# Patient Record
Sex: Female | Born: 1990
Health system: Southern US, Community
[De-identification: ages and names within clinical notes are randomized; demographics above are authoritative.]

## PROBLEM LIST (undated history)

## (undated) ENCOUNTER — Inpatient Hospital Stay (HOSPITAL_COMMUNITY): Payer: Self-pay

## (undated) DIAGNOSIS — F449 Dissociative and conversion disorder, unspecified: Secondary | ICD-10-CM

## (undated) DIAGNOSIS — T7421XA Adult sexual abuse, confirmed, initial encounter: Secondary | ICD-10-CM

## (undated) DIAGNOSIS — F4310 Post-traumatic stress disorder, unspecified: Secondary | ICD-10-CM

## (undated) DIAGNOSIS — F32A Depression, unspecified: Secondary | ICD-10-CM

## (undated) DIAGNOSIS — Z8619 Personal history of other infectious and parasitic diseases: Secondary | ICD-10-CM

## (undated) DIAGNOSIS — R569 Unspecified convulsions: Secondary | ICD-10-CM

## (undated) DIAGNOSIS — F445 Conversion disorder with seizures or convulsions: Secondary | ICD-10-CM

## (undated) HISTORY — PX: DILATION AND CURETTAGE OF UTERUS: SHX78

## (undated) HISTORY — DX: Personal history of other infectious and parasitic diseases: Z86.19

## (undated) HISTORY — PX: CHOLECYSTECTOMY: SHX55

## (undated) HISTORY — DX: Dissociative and conversion disorder, unspecified: F44.9

## (undated) HISTORY — PX: MOUTH SURGERY: SHX715

## (undated) HISTORY — DX: Post-traumatic stress disorder, unspecified: F43.10

## (undated) HISTORY — DX: Unspecified convulsions: R56.9

## (undated) HISTORY — DX: Conversion disorder with seizures or convulsions: F44.5

---
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
## 2009-08-09 ENCOUNTER — Emergency Department (HOSPITAL_COMMUNITY): Admission: EM | Admit: 2009-08-09 | Discharge: 2009-08-09 | Payer: Self-pay | Admitting: Emergency Medicine

## 2009-08-29 ENCOUNTER — Emergency Department (HOSPITAL_COMMUNITY): Admission: EM | Admit: 2009-08-29 | Discharge: 2009-08-29 | Payer: Self-pay | Admitting: Emergency Medicine

## 2010-03-13 ENCOUNTER — Emergency Department (HOSPITAL_COMMUNITY): Admission: EM | Admit: 2010-03-13 | Discharge: 2010-03-13 | Payer: Self-pay | Admitting: Emergency Medicine

## 2010-07-27 LAB — POCT PREGNANCY, URINE: Preg Test, Ur: NEGATIVE

## 2010-08-02 LAB — URINALYSIS, ROUTINE W REFLEX MICROSCOPIC
Bilirubin Urine: NEGATIVE
Glucose, UA: NEGATIVE mg/dL
Ketones, ur: NEGATIVE mg/dL
Leukocytes, UA: NEGATIVE
Nitrite: NEGATIVE
Protein, ur: NEGATIVE mg/dL
Specific Gravity, Urine: >1.03 — ABNORMAL HIGH (ref 1.005–1.030)
Urobilinogen, UA: 0.2 mg/dL (ref 0.0–1.0)
pH: 5.5 (ref 5.0–8.0)

## 2010-08-02 LAB — WET PREP, GENITAL
Clue Cells Wet Prep HPF POC: NONE SEEN
Trich, Wet Prep: NONE SEEN
WBC, Wet Prep HPF POC: NONE SEEN
Yeast Wet Prep HPF POC: NONE SEEN

## 2010-08-02 LAB — GC/CHLAMYDIA PROBE AMP, GENITAL
Chlamydia, DNA Probe: NEGATIVE
GC Probe Amp, Genital: NEGATIVE

## 2010-08-02 LAB — PREGNANCY, URINE: Preg Test, Ur: NEGATIVE

## 2010-08-02 LAB — URINE MICROSCOPIC-ADD ON

## 2010-08-02 LAB — HCG, QUANTITATIVE, PREGNANCY: hCG, Beta Chain, Quant, S: <2 m[IU]/mL (ref <5)
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
## 2010-09-30 NOTE — Procedures (Signed)
CLINICAL HISTORY:  This 20 year old patient is thought to have had seizures,  possibly representing pseudoseizures in the past.   TECHNICAL DESCRIPTION:  This EEG was recorded during the awake state.  Background activity shows a well formed alpha activity in the 12 to 14 hertz  range with superimposed low voltage fast beta activity and some muscle  artifact in the anterior head regions. No Stage 2 sleep was present. No  focal asymmetries or epileptiform activity was seen. Photic stimulation was  performed which produced no abnormalities. Hyperventilation testing did not  produce any abnormalities.   IMPRESSION:  This is a normal EEG during the awake state.    Evie Lacks, M.D.   ZOX:WRUE  D:  01/15/2004 12:05:20  T:  01/16/2004 10:17:00  Job #:  [id number]
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
## 2010-10-04 IMAGING — CT CT CERVICAL SPINE W/O CM
1 of 2 series · 9 of 14 positions shown, 12 images · non-contrast
Comparison: Head CT 03/27/2008.  Cervical spine CT 03/07/2004.

CLINICAL DATA: Fall yesterday.

CT HEAD WITHOUT CONTRAST
TECHNIQUE: Contiguous axial images were obtained from the base of
the skull through the vertex without contrast
TECHNIQUE: Continous axial images were obtained of the cervical
spine without contrast.  Sagittal and coronal reformats were
constructed.

[Series 4: cervical 2.0 b31s · axial · 0.24mm/px · z∈[+48,+186]mm · 9 of 116 slices shown, 12 images]
[im 12/116  soft-tissue]
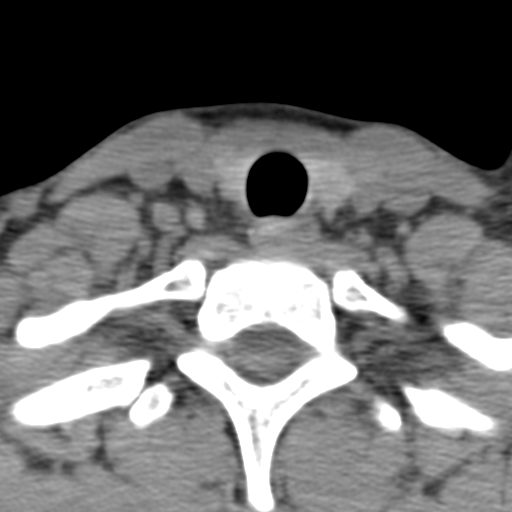
[im 12/116  bone]
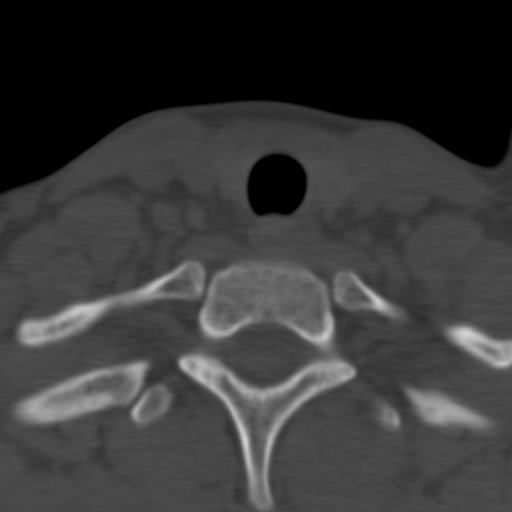
[im 24/116  bone]
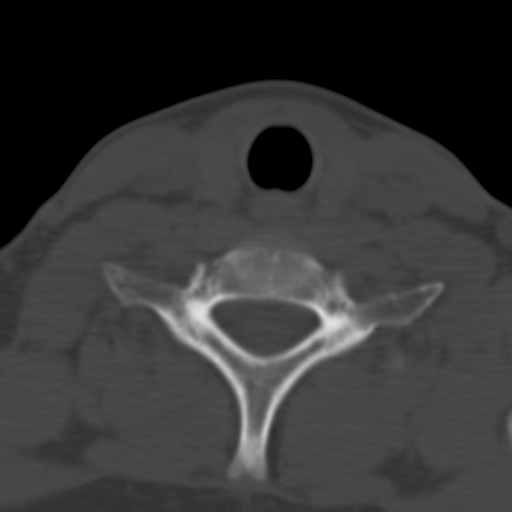
[im 35/116  bone]
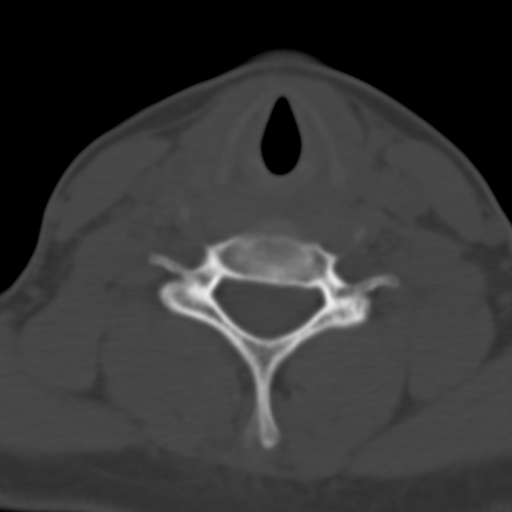
[im 47/116  bone]
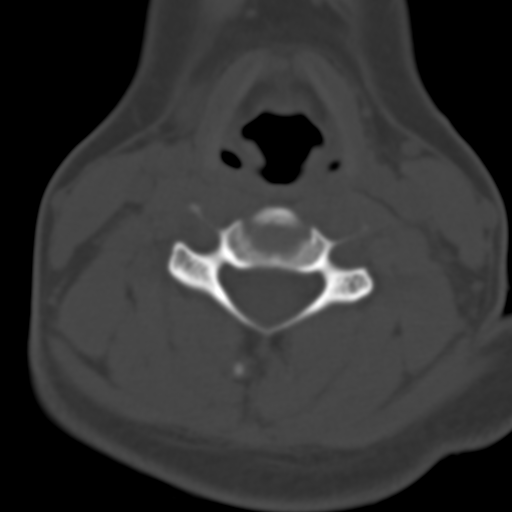
[im 58/116  soft-tissue]
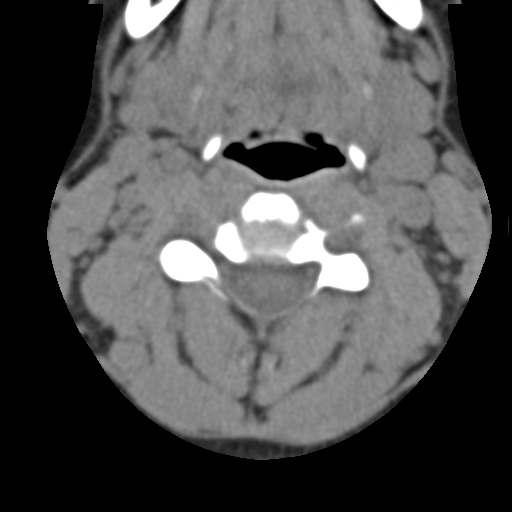
[im 58/116  bone]
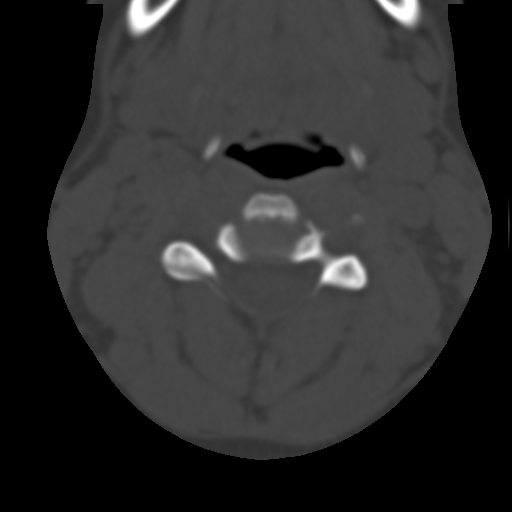
[im 70/116  bone]
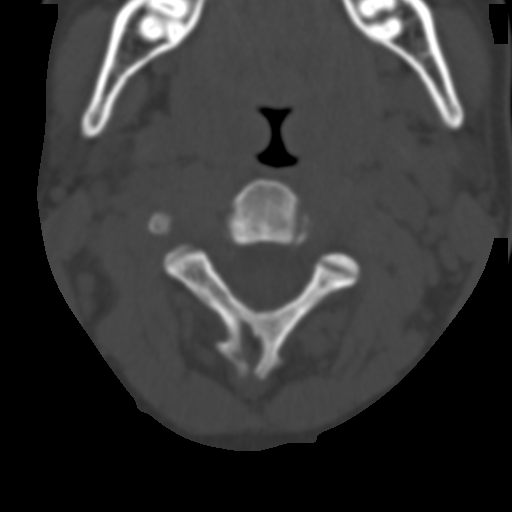
[im 81/116  bone]
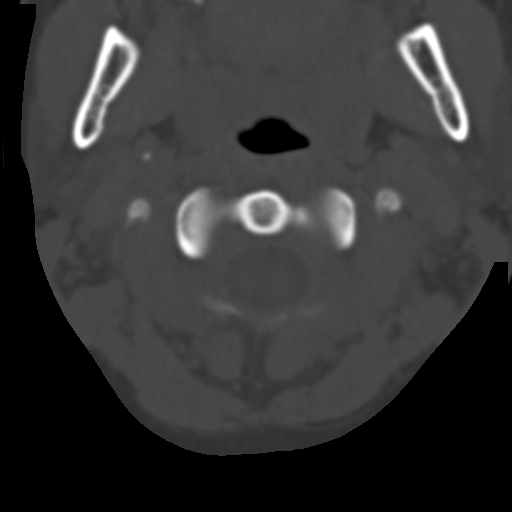
[im 93/116  bone]
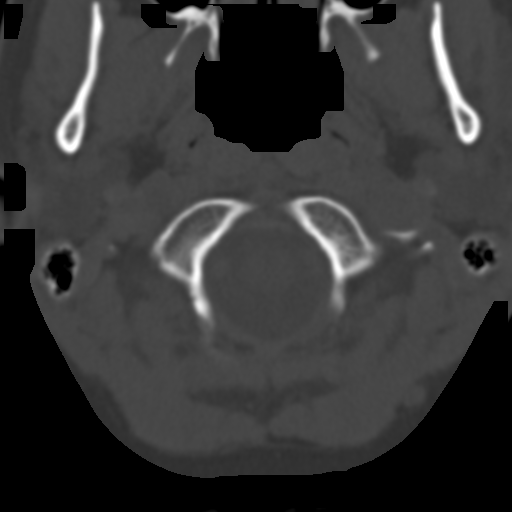
[im 104/116  soft-tissue]
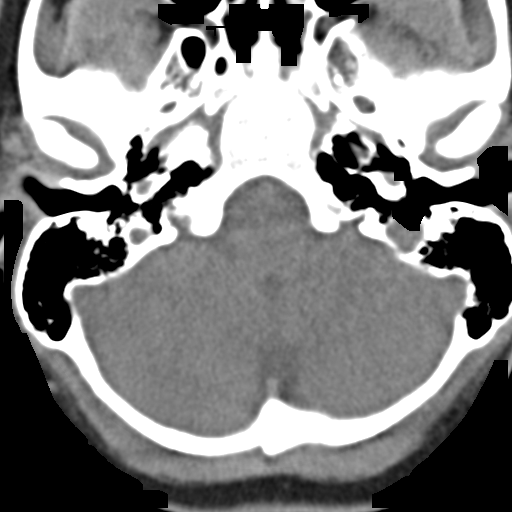
[im 104/116  bone]
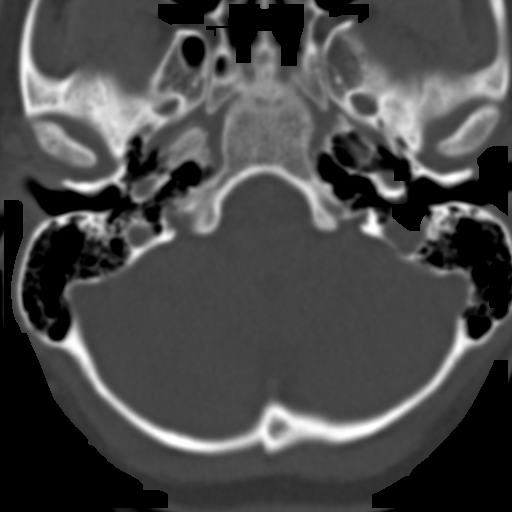

[9 of 14 positions shown; findings below may reference images not displayed]

FINDINGS: Bone windows demonstrate no significant soft tissue
swelling. Clear paranasal sinuses and mastoid air cells. No skull
fracture.

Soft tissue windows demonstrate no  mass lesion, hemorrhage,
hydrocephalus, acute infarct, intra-axial, or extra-axial fluid
collection.
IMPRESSION: No acute intracranial abnormality.

CT CERVICAL SPINE WITHOUT CONTRAST
FINDINGS: Spinal visualization through the bottom of T1.
Prevertebral soft tissues are normal.  No apical pneumothorax.
Skull base intact.  Mild straightening expected cervical lordosis.
No evidence of acute fracture or subluxation.  Facets are well-
aligned.  C1-C2 articulation within normal limits.
IMPRESSION: 1.  No evidence of cervical spine fracture or subluxation.
2.  Soft tissue/ligamentous injury cannot be excluded.
3. Straightening of expected cervical lordosis could be positional,
due to muscular spasm, or ligamentous injury.

## 2011-09-01 LAB — OB RESULTS CONSOLE RPR: RPR: NONREACTIVE

## 2011-09-01 LAB — OB RESULTS CONSOLE HEPATITIS B SURFACE ANTIGEN: Hepatitis B Surface Ag: NEGATIVE

## 2011-09-01 LAB — OB RESULTS CONSOLE RUBELLA ANTIBODY, IGM: Rubella: IMMUNE

## 2011-09-01 LAB — OB RESULTS CONSOLE ANTIBODY SCREEN: Antibody Screen: NEGATIVE

## 2011-09-01 LAB — OB RESULTS CONSOLE HIV ANTIBODY (ROUTINE TESTING): HIV: NONREACTIVE

## 2011-09-04 ENCOUNTER — Other Ambulatory Visit (HOSPITAL_COMMUNITY)
Admission: RE | Admit: 2011-09-04 | Discharge: 2011-09-04 | Disposition: A | Discharge status: Home / Self Care | Payer: Medicaid Other | Source: Ambulatory Visit | Attending: Obstetrics and Gynecology | Admitting: Obstetrics and Gynecology

## 2011-09-04 ENCOUNTER — Other Ambulatory Visit: Payer: Self-pay | Admitting: Adult Health

## 2011-09-04 DIAGNOSIS — Z113 Encounter for screening for infections with a predominantly sexual mode of transmission: Secondary | ICD-10-CM | POA: Insufficient documentation

## 2011-09-04 DIAGNOSIS — Z01419 Encounter for gynecological examination (general) (routine) without abnormal findings: Secondary | ICD-10-CM | POA: Insufficient documentation

## 2012-03-22 ENCOUNTER — Encounter (HOSPITAL_COMMUNITY): Payer: Self-pay | Admitting: *Deleted

## 2012-03-22 ENCOUNTER — Inpatient Hospital Stay (HOSPITAL_COMMUNITY)
Admission: AD | Admit: 2012-03-22 | Discharge: 2012-03-22 | Disposition: A | Discharge status: Home / Self Care | Payer: Medicaid Other | Source: Ambulatory Visit | Attending: Obstetrics & Gynecology | Admitting: Obstetrics & Gynecology

## 2012-03-22 DIAGNOSIS — M549 Dorsalgia, unspecified: Secondary | ICD-10-CM | POA: Insufficient documentation

## 2012-03-22 DIAGNOSIS — O99891 Other specified diseases and conditions complicating pregnancy: Secondary | ICD-10-CM

## 2012-03-22 DIAGNOSIS — R109 Unspecified abdominal pain: Secondary | ICD-10-CM | POA: Insufficient documentation

## 2012-03-22 DIAGNOSIS — O4700 False labor before 37 completed weeks of gestation, unspecified trimester: Secondary | ICD-10-CM | POA: Insufficient documentation

## 2012-03-22 HISTORY — DX: Unspecified convulsions: R56.9

## 2012-03-22 LAB — URINE MICROSCOPIC-ADD ON

## 2012-03-22 LAB — URINALYSIS, ROUTINE W REFLEX MICROSCOPIC
Bilirubin Urine: NEGATIVE
Glucose, UA: NEGATIVE mg/dL
Ketones, ur: NEGATIVE mg/dL
Leukocytes, UA: NEGATIVE
Nitrite: NEGATIVE
Protein, ur: NEGATIVE mg/dL
Specific Gravity, Urine: 1.01 (ref 1.005–1.030)
Urobilinogen, UA: 0.2 mg/dL (ref 0.0–1.0)
pH: 7 (ref 5.0–8.0)

## 2012-03-22 MED ORDER — CYCLOBENZAPRINE HCL 5 MG PO TABS: 5.0000 mg | ORAL_TABLET | Freq: Three times a day (TID) | ORAL | Status: DC | PRN

## 2012-03-22 MED ORDER — CYCLOBENZAPRINE HCL 10 MG PO TABS
5.0000 mg | ORAL_TABLET | Freq: Once | ORAL | Status: AC
  Administered 2012-03-22: 5 mg via ORAL
  Filled 2012-03-22: qty 1

## 2012-03-22 MED ORDER — ACETAMINOPHEN 325 MG PO TABS
650.0000 mg | ORAL_TABLET | Freq: Four times a day (QID) | ORAL | Status: DC | PRN
  Administered 2012-03-22: 650 mg via ORAL
  Filled 2012-03-22: qty 2

## 2012-03-22 NOTE — MAU Note (Signed)
Pt states she has a sudden onset of lower back pain around 0030-it is constant

## 2012-03-22 NOTE — MAU Provider Note (Signed)
  History     CSN: [id number]  Arrival date and time: 03/22/12 0126   None     Chief Complaint  Patient presents with  . Back Pain   HPI Patient is a 21 yo G1 at [redacted]w[redacted]d presenting for sudden onset severe back pain. She has some cramping in her abdomen, but her pain is 5/10 in her lower back. She denies LOF, bleeding or vaginal discharge. Good fetal movement. No dysuria.  She is seen at [facility]. PMH of PTSD, conversion disorder and pseudoseizures. No current medications.   OB History    Grav Para Term Preterm Abortions TAB SAB Ect Mult Living   1               Past Medical History  Diagnosis Date  . Seizures     Past Surgical History  Procedure Date  . Oral antral fistula closure     oral surgery    Family History  Problem Relation Age of Onset  . Cancer Mother   . Diabetes Father     History  Substance Use Topics  . Smoking status: Current Every Day Smoker -- 0.5 packs/day  . Smokeless tobacco: Never Used  . Alcohol Use: No    Allergies:  Allergies  Allergen Reactions  . Ambien (Zolpidem Tartrate)     seizures    Prescriptions prior to admission  Medication Sig Dispense Refill  . Prenatal Vit-Fe Fumarate-FA (MULTIVITAMIN-PRENATAL) 27-0.8 MG TABS Take 1 tablet by mouth daily.        Review of Systems  Constitutional: Negative for fever and chills.  Respiratory: Negative for shortness of breath.   Cardiovascular: Negative for chest pain.  Gastrointestinal: Negative for nausea and vomiting.  Genitourinary: Positive for frequency. Negative for dysuria.  Musculoskeletal: Positive for back pain.  Neurological: Negative for dizziness and headaches.   Physical Exam   Blood pressure 124/66, pulse 108, temperature 98.7 F (37.1 C), temperature source Oral, resp. rate 20, height 5\' 8"  (1.727 m), weight 92.534 kg (204 lb), SpO2 100.00%.  Physical Exam  Constitutional: She is oriented to person, place, and time. She appears well-developed and  well-nourished. She appears distressed (Appears uncomfortable with movement.).  HENT:  Head: Normocephalic and atraumatic.  Neck: Normal range of motion.  Cardiovascular: Normal rate and regular rhythm.   Respiratory: Effort normal and breath sounds normal.  GI: Soft. There is no tenderness.       Gravid. Toco in place.  Musculoskeletal: Normal range of motion. She exhibits no edema and no tenderness.  Neurological: She is alert and oriented to person, place, and time.  Skin: Skin is dry. No rash noted.    MAU Course  Procedures  Dilation: Closed Effacement (%): Thick Cervical Position: Posterior Exam by:: Dr Mikel Cella  No contractions noted on montior FHT: 145-150, reactive. No decels  MDM Discussed with Dr. Thad Ranger. Will send UA, but most likely secondary to MSK pain  Assessment and Plan  21 yo G1 at [redacted]w[redacted]d presenting for labor evaluation. Not in active labor. Back pain could be MSK or early labor.  1. False labor 2. Back pain 3. Preterm pregnancy  - Will treat MSK pain with Flexeril and Tylenol as needed. Use support for gravid abdomen, if possible.  - Given preterm labor precautions - Discharged home on stable medical condition - Follow up with [facility] as scheduled for 36 week appointment  Dr. Thad Ranger has seen patient.  Aryelle Figg 03/22/2012, 2:13 AM

## 2012-03-23 NOTE — MAU Provider Note (Signed)
I saw and examined patient and agree with above note and plan. Patient was resting comfortably at time of my exam and stated back pain was much improved. I reviewed NST which was reactive. Napoleon Form, MD

## 2012-03-26 LAB — OB RESULTS CONSOLE GBS: GBS: NEGATIVE

## 2012-03-26 LAB — OB RESULTS CONSOLE GC/CHLAMYDIA: Chlamydia: NEGATIVE

## 2012-04-25 ENCOUNTER — Encounter (HOSPITAL_COMMUNITY): Payer: Self-pay | Admitting: *Deleted

## 2012-04-25 ENCOUNTER — Telehealth (HOSPITAL_COMMUNITY): Payer: Self-pay | Admitting: *Deleted

## 2012-04-25 NOTE — Telephone Encounter (Signed)
Preadmission screen  

## 2012-04-26 ENCOUNTER — Inpatient Hospital Stay (HOSPITAL_COMMUNITY)
Admission: AD | Admit: 2012-04-26 | Discharge: 2012-04-28 | DRG: 774 | Disposition: A | Discharge status: Home / Self Care | Payer: Medicaid Other | Source: Ambulatory Visit | Attending: Obstetrics & Gynecology | Admitting: Obstetrics & Gynecology

## 2012-04-26 ENCOUNTER — Inpatient Hospital Stay (HOSPITAL_COMMUNITY): Payer: Medicaid Other | Admitting: Anesthesiology

## 2012-04-26 ENCOUNTER — Encounter (HOSPITAL_COMMUNITY): Payer: Self-pay

## 2012-04-26 ENCOUNTER — Encounter (HOSPITAL_COMMUNITY): Payer: Self-pay | Admitting: Anesthesiology

## 2012-04-26 DIAGNOSIS — O1404 Mild to moderate pre-eclampsia, complicating childbirth: Secondary | ICD-10-CM | POA: Diagnosis present

## 2012-04-26 DIAGNOSIS — O1400 Mild to moderate pre-eclampsia, unspecified trimester: Secondary | ICD-10-CM | POA: Diagnosis present

## 2012-04-26 DIAGNOSIS — IMO0002 Reserved for concepts with insufficient information to code with codable children: Principal | ICD-10-CM | POA: Diagnosis present

## 2012-04-26 DIAGNOSIS — R7989 Other specified abnormal findings of blood chemistry: Secondary | ICD-10-CM | POA: Diagnosis present

## 2012-04-26 DIAGNOSIS — IMO0001 Reserved for inherently not codable concepts without codable children: Secondary | ICD-10-CM

## 2012-04-26 HISTORY — DX: Adult sexual abuse, confirmed, initial encounter: T74.21XA

## 2012-04-26 LAB — COMPREHENSIVE METABOLIC PANEL
Albumin: 2.5 g/dL — ABNORMAL LOW (ref 3.5–5.2)
BUN: 6 mg/dL (ref 6–23)
Calcium: 8.8 mg/dL (ref 8.4–10.5)
Creatinine, Ser: 0.49 mg/dL — ABNORMAL LOW (ref 0.50–1.10)
GFR calc Af Amer: >90 mL/min (ref >90)
Glucose, Bld: 87 mg/dL (ref 70–99)
Total Protein: 6.5 g/dL (ref 6.0–8.3)

## 2012-04-26 LAB — CBC
HCT: 32.1 % — ABNORMAL LOW (ref 36.0–46.0)
Hemoglobin: 10.2 g/dL — ABNORMAL LOW (ref 12.0–15.0)
MCH: 24.9 pg — ABNORMAL LOW (ref 26.0–34.0)
MCHC: 31.8 g/dL (ref 30.0–36.0)
MCV: 78.5 fL (ref 78.0–100.0)
Platelets: 272 10*3/uL (ref 150–400)
RBC: 4.04 MIL/uL (ref 3.87–5.11)
RDW: 13.7 % (ref 11.5–15.5)
RDW: 13.8 % (ref 11.5–15.5)
WBC: 22.5 10*3/uL — ABNORMAL HIGH (ref 4.0–10.5)

## 2012-04-26 LAB — PROTEIN / CREATININE RATIO, URINE
Creatinine, Urine: 101.05 mg/dL
Total Protein, Urine: 88 mg/dL

## 2012-04-26 LAB — RPR: RPR Ser Ql: NONREACTIVE

## 2012-04-26 MED ORDER — TETANUS-DIPHTH-ACELL PERTUSSIS 5-2.5-18.5 LF-MCG/0.5 IM SUSP
0.5000 mL | Freq: Once | INTRAMUSCULAR | Status: AC
  Administered 2012-04-27: 0.5 mL via INTRAMUSCULAR
  Filled 2012-04-26 (×2): qty 0.5

## 2012-04-26 MED ORDER — PHENYLEPHRINE 40 MCG/ML (10ML) SYRINGE FOR IV PUSH (FOR BLOOD PRESSURE SUPPORT)
80.0000 ug | PREFILLED_SYRINGE | INTRAVENOUS | Status: DC | PRN
  Filled 2012-04-26: qty 5

## 2012-04-26 MED ORDER — OXYCODONE-ACETAMINOPHEN 5-325 MG PO TABS
1.0000 | ORAL_TABLET | ORAL | Status: DC | PRN
Start: 2012-04-26 — End: 2012-04-28
  Administered 2012-04-27 – 2012-04-28 (×4): 1 via ORAL
  Filled 2012-04-26 (×4): qty 1

## 2012-04-26 MED ORDER — DIBUCAINE 1 % RE OINT
1.0000 "application " | TOPICAL_OINTMENT | RECTAL | Status: DC | PRN
  Filled 2012-04-26: qty 28

## 2012-04-26 MED ORDER — MAGNESIUM SULFATE 40 G IN LACTATED RINGERS - SIMPLE
2.0000 g/h | INTRAVENOUS | Status: AC
  Administered 2012-04-27: 2 g/h via INTRAVENOUS
  Filled 2012-04-26 (×2): qty 500

## 2012-04-26 MED ORDER — ONDANSETRON HCL 4 MG/2ML IJ SOLN: 4.0000 mg | INTRAMUSCULAR | Status: DC | PRN

## 2012-04-26 MED ORDER — DIPHENHYDRAMINE HCL 50 MG/ML IJ SOLN: 12.5000 mg | INTRAMUSCULAR | Status: DC | PRN

## 2012-04-26 MED ORDER — LACTATED RINGERS IV SOLN
INTRAVENOUS | Status: DC
  Administered 2012-04-26 (×2): via INTRAVENOUS

## 2012-04-26 MED ORDER — ONDANSETRON HCL 4 MG/2ML IJ SOLN: 4.0000 mg | Freq: Four times a day (QID) | INTRAMUSCULAR | Status: DC | PRN

## 2012-04-26 MED ORDER — TERBUTALINE SULFATE 1 MG/ML IJ SOLN: 0.2500 mg | Freq: Once | INTRAMUSCULAR | Status: DC | PRN

## 2012-04-26 MED ORDER — ACETAMINOPHEN 325 MG PO TABS: 650.0000 mg | ORAL_TABLET | ORAL | Status: DC | PRN

## 2012-04-26 MED ORDER — LANOLIN HYDROUS EX OINT: TOPICAL_OINTMENT | CUTANEOUS | Status: DC | PRN

## 2012-04-26 MED ORDER — PRENATAL MULTIVITAMIN CH
1.0000 | ORAL_TABLET | Freq: Every day | ORAL | Status: DC
  Administered 2012-04-28: 1 via ORAL
  Filled 2012-04-26 (×2): qty 1

## 2012-04-26 MED ORDER — NALBUPHINE SYRINGE 5 MG/0.5 ML
10.0000 mg | INJECTION | INTRAMUSCULAR | Status: DC | PRN
  Administered 2012-04-26 (×2): 10 mg via INTRAVENOUS
  Filled 2012-04-26 (×2): qty 1

## 2012-04-26 MED ORDER — FENTANYL 2.5 MCG/ML BUPIVACAINE 1/10 % EPIDURAL INFUSION (WH - ANES)
14.0000 mL/h | INTRAMUSCULAR | Status: DC
  Administered 2012-04-26: 14 mL/h via EPIDURAL
  Filled 2012-04-26: qty 125

## 2012-04-26 MED ORDER — SODIUM BICARBONATE 8.4 % IV SOLN
INTRAVENOUS | Status: DC | PRN
  Administered 2012-04-26: 5 mL via EPIDURAL

## 2012-04-26 MED ORDER — SENNOSIDES-DOCUSATE SODIUM 8.6-50 MG PO TABS
2.0000 | ORAL_TABLET | Freq: Every day | ORAL | Status: DC
  Administered 2012-04-26 – 2012-04-27 (×2): 2 via ORAL

## 2012-04-26 MED ORDER — CITRIC ACID-SODIUM CITRATE 334-500 MG/5ML PO SOLN: 30.0000 mL | ORAL | Status: DC | PRN

## 2012-04-26 MED ORDER — OXYTOCIN 40 UNITS IN LACTATED RINGERS INFUSION - SIMPLE MED
1.0000 m[IU]/min | INTRAVENOUS | Status: DC
  Administered 2012-04-26: 2 m[IU]/min via INTRAVENOUS

## 2012-04-26 MED ORDER — LIDOCAINE HCL (PF) 1 % IJ SOLN
30.0000 mL | INTRAMUSCULAR | Status: DC | PRN
  Administered 2012-04-26: 30 mL via SUBCUTANEOUS
  Filled 2012-04-26: qty 30

## 2012-04-26 MED ORDER — MAGNESIUM SULFATE BOLUS VIA INFUSION
4.0000 g | Freq: Once | INTRAVENOUS | Status: DC
  Administered 2012-04-26: 4 g via INTRAVENOUS
  Filled 2012-04-26: qty 500

## 2012-04-26 MED ORDER — LACTATED RINGERS IV SOLN: 500.0000 mL | INTRAVENOUS | Status: DC | PRN

## 2012-04-26 MED ORDER — OXYTOCIN 40 UNITS IN LACTATED RINGERS INFUSION - SIMPLE MED
62.5000 mL/h | INTRAVENOUS | Status: DC
  Administered 2012-04-26: 62.5 mL/h via INTRAVENOUS
  Filled 2012-04-26: qty 1000

## 2012-04-26 MED ORDER — SIMETHICONE 80 MG PO CHEW: 80.0000 mg | CHEWABLE_TABLET | ORAL | Status: DC | PRN

## 2012-04-26 MED ORDER — WITCH HAZEL-GLYCERIN EX PADS: 1.0000 "application " | MEDICATED_PAD | CUTANEOUS | Status: DC | PRN

## 2012-04-26 MED ORDER — ONDANSETRON HCL 4 MG PO TABS: 4.0000 mg | ORAL_TABLET | ORAL | Status: DC | PRN

## 2012-04-26 MED ORDER — IBUPROFEN 600 MG PO TABS: 600.0000 mg | ORAL_TABLET | Freq: Four times a day (QID) | ORAL | Status: DC | PRN

## 2012-04-26 MED ORDER — IBUPROFEN 600 MG PO TABS
600.0000 mg | ORAL_TABLET | Freq: Four times a day (QID) | ORAL | Status: DC
  Administered 2012-04-26 – 2012-04-28 (×6): 600 mg via ORAL
  Filled 2012-04-26 (×6): qty 1

## 2012-04-26 MED ORDER — OXYCODONE-ACETAMINOPHEN 5-325 MG PO TABS: 1.0000 | ORAL_TABLET | ORAL | Status: DC | PRN

## 2012-04-26 MED ORDER — LACTATED RINGERS IV SOLN
500.0000 mL | Freq: Once | INTRAVENOUS | Status: AC
  Administered 2012-04-26: 1000 mL via INTRAVENOUS

## 2012-04-26 MED ORDER — DIPHENHYDRAMINE HCL 25 MG PO CAPS: 25.0000 mg | ORAL_CAPSULE | Freq: Four times a day (QID) | ORAL | Status: DC | PRN

## 2012-04-26 MED ORDER — MAGNESIUM SULFATE 40 G IN LACTATED RINGERS - SIMPLE
2.0000 g/h | INTRAVENOUS | Status: DC
  Administered 2012-04-26: 4 g/h via INTRAVENOUS
  Filled 2012-04-26: qty 500

## 2012-04-26 MED ORDER — BENZOCAINE-MENTHOL 20-0.5 % EX AERO
1.0000 "application " | INHALATION_SPRAY | CUTANEOUS | Status: DC | PRN
  Filled 2012-04-26: qty 56

## 2012-04-26 MED ORDER — EPHEDRINE 5 MG/ML INJ
10.0000 mg | INTRAVENOUS | Status: DC | PRN
  Filled 2012-04-26: qty 4

## 2012-04-26 MED ORDER — EPHEDRINE 5 MG/ML INJ: 10.0000 mg | INTRAVENOUS | Status: DC | PRN

## 2012-04-26 MED ORDER — PHENYLEPHRINE 40 MCG/ML (10ML) SYRINGE FOR IV PUSH (FOR BLOOD PRESSURE SUPPORT): 80.0000 ug | PREFILLED_SYRINGE | INTRAVENOUS | Status: DC | PRN

## 2012-04-26 MED ORDER — OXYTOCIN BOLUS FROM INFUSION
500.0000 mL | INTRAVENOUS | Status: DC
  Administered 2012-04-26: 500 mL via INTRAVENOUS

## 2012-04-26 NOTE — Progress Notes (Signed)
Patient ID: Hannah Peters, female   DOB: 08-09-1990, 21 y.o.   MRN: [id number] Delivery Note At 8:29 PM a viable and healthy female was delivered via  (Presentation:LOA  ;vertex  ).  APGAR9:9 , ; weight .  pending Placenta status Intact Tomasa Blase presentation: ,3-vessel .  Cord:  with the following complications:none .  Cord pH: not checked  Anesthesia: epidural, local for repair   Episiotomy: none  Lacerations: 2nd degree midline Suture Repair: 3.0 monocryl Est. Blood Loss (mL):   Mom to AICU to continue mag sulfate x 24 hours.  Baby to nursery-stable.  Recardo Linn V 04/26/2012, 8:49 PM

## 2012-04-26 NOTE — MAU Note (Signed)
FCresenzo-Dishmon, CNM notified of pt's cervical exam, no new orders placed, Dr. Thad Ranger will come see pt in MAU.

## 2012-04-26 NOTE — Anesthesia Preprocedure Evaluation (Addendum)

## 2012-04-26 NOTE — MAU Note (Signed)
Pt states contractions started around 0230. States contractions are ever 2-4 minutes. No leaking of fluid.

## 2012-04-26 NOTE — MAU Note (Signed)
FCresenzo-Dishmon, CNM notified pt more uncomfortable, still waiting for protein-creatinine ratio. Ok for RN to check pt and call with results.

## 2012-04-26 NOTE — H&P (Signed)
Hannah Peters is a 21 y.o. female presenting for onset of labor. Maternal Medical History:  Reason for admission: Reason for admission: contractions.  Reason for Admission:   nauseaContractions: Onset was 6-12 hours ago.   Frequency: regular.   Duration is approximately 1 minute.   Perceived severity is strong.    Fetal activity: Perceived fetal activity is normal.   Last perceived fetal movement was within the past hour.    Prenatal complications: no prenatal complications Prenatal Complications - Diabetes: none.    OB History    Grav Para Term Preterm Abortions TAB SAB Ect Mult Living   2 0   1  1   0     Past Medical History  Diagnosis Date  . Seizures   . PTSD (post-traumatic stress disorder)   . Conversion disorder   . Hx of chlamydia infection   . Pseudoseizures    Past Surgical History  Procedure Date  . Oral antral fistula closure     oral surgery  . Dilation and curettage of uterus   . Mouth surgery    Family History: family history includes Cancer in her mother; Diabetes in her father; Heart attack in her maternal grandmother, paternal grandfather, and paternal grandmother; Hypertension in her father; Mental retardation in her paternal uncle; and Stroke in her paternal grandfather. Social History:  reports that she has been smoking.  She has never used smokeless tobacco. She reports that she uses illicit drugs (Marijuana). She reports that she does not drink alcohol.   Prenatal Transfer Tool  Maternal Diabetes: No Genetic Screening: Declined Maternal Ultrasounds/Referrals: Normal Fetal Ultrasounds or other Referrals:  None Maternal Substance Abuse:  No Significant Maternal Medications:  None Significant Maternal Lab Results:  Lab values include: Group B Strep negative Other Comments:  None  Review of Systems  Constitutional: Negative for fever and chills.  Eyes: Negative for blurred vision and double vision.  Gastrointestinal: Negative for nausea and  vomiting.  Neurological: Negative for dizziness and headaches.    Dilation: 3 Effacement (%): 90 Station: -1 Exam by:: SBeck, RN Blood pressure 137/86, pulse 97, temperature 97.3 F (36.3 C), temperature source Oral, resp. rate 18, height 5\' 8"  (1.727 m), weight 93.26 kg (205 lb 9.6 oz), SpO2 100.00%. Maternal Exam:  Uterine Assessment: Contraction strength is firm.  Contraction frequency is regular.   Abdomen: Fetal presentation: vertex  Cervix: Cervix evaluated by digital exam.     Fetal Exam Fetal Monitor Review: Mode: ultrasound.   Baseline rate: 135.  Variability: moderate (6-25 bpm).   Pattern: accelerations present and no decelerations.    Fetal State Assessment: Category I - tracings are normal.     Physical Exam  Constitutional: She is oriented to person, place, and time. She appears well-developed and well-nourished. No distress.  HENT:  Head: Normocephalic and atraumatic.  Eyes: Conjunctivae normal and EOM are normal.  Cardiovascular: Normal rate.   Respiratory: Effort normal. No respiratory distress.  GI: There is no tenderness. There is no rebound and no guarding.  Musculoskeletal: Normal range of motion. She exhibits no edema and no tenderness.  Neurological: She is alert and oriented to person, place, and time.  Skin: Skin is warm and dry.  Psychiatric: She has a normal mood and affect.    Prenatal labs: ABO, Rh: A/Positive/-- (04/19 0000) Antibody: Negative (04/19 0000) Rubella: Immune (04/19 0000) RPR: Nonreactive (04/19 0000)  HBsAg: Negative (04/19 0000)  HIV: Non-reactive (04/19 0000)  GBS: Negative (11/12 0000)   Assessment/Plan:  21 y.o. G2P0010 at [redacted]w[redacted]d with SOL - GBS negative - Admit to L&D - Elevated BP intermittently, protein/creatinine ratio 0.87. Will discuss mag with Dr. Jolayne Panther.  Napoleon Form 04/26/2012, 8:58 AM

## 2012-04-26 NOTE — Anesthesia Procedure Notes (Signed)

## 2012-04-26 NOTE — ED Provider Notes (Signed)
None     Chief Complaint:  Labor Eval   Hannah Peters is  21 y.o. G2P0010 at [redacted]w[redacted]d presents complaining of Labor Eval .  She states regular, every 2-4 minutes contractions are associated with none vaginal bleeding, intact membranes, along with active fetal movement. Denies HA, vision changes, RUQ pain.  States cx was 2/50 in office today per Dr. Despina Hidden  Obstetrical/Gynecological History: Menstrual History: OB History    Grav Para Term Preterm Abortions TAB SAB Ect Mult Living   2 0   1  1   0       No LMP recorded. Patient is pregnant.     Past Medical History: Past Medical History  Diagnosis Date  . Seizures   . PTSD (post-traumatic stress disorder)   . Conversion disorder   . Hx of chlamydia infection   . Pseudoseizures     Past Surgical History: Past Surgical History  Procedure Date  . Oral antral fistula closure     oral surgery  . Dilation and curettage of uterus   . Mouth surgery     Family History: Family History  Problem Relation Age of Onset  . Cancer Mother   . Diabetes Father   . Hypertension Father   . Mental retardation Paternal Uncle   . Heart attack Maternal Grandmother   . Heart attack Paternal Grandmother   . Heart attack Paternal Grandfather   . Stroke Paternal Grandfather     Social History: History  Substance Use Topics  . Smoking status: Current Every Day Smoker -- 0.5 packs/day  . Smokeless tobacco: Never Used  . Alcohol Use: No    Allergies:  Allergies  Allergen Reactions  . Ambien (Zolpidem Tartrate)     seizures  . Latex     rash    Meds:  Prescriptions prior to admission  Medication Sig Dispense Refill  . Prenatal Vit-Fe Fumarate-FA (MULTIVITAMIN-PRENATAL) 27-0.8 MG TABS Take 1 tablet by mouth daily.      . cyclobenzaprine (FLEXERIL) 5 MG tablet Take 1 tablet (5 mg total) by mouth every 8 (eight) hours as needed for muscle spasms.  30 tablet  1    Review of Systems - Please refer to the aforementioned patients'  reports.     Physical Exam  Blood pressure 148/89, pulse 106, temperature 97.3 F (36.3 C), temperature source Oral, resp. rate 18, height 5\' 8"  (1.727 m), weight 205 lb 9.6 oz (93.26 kg), SpO2 99.00%. GENERAL: Well-developed, well-nourished female in no acute distress.  LUNGS: Clear to auscultation bilaterally.  HEART: Regular rate and rhythm. ABDOMEN: Soft, nontender, nondistended, gravid.  EXTREMITIES: Nontender, no edema, 2+ distal pulses. CERVICAL EXAM: Dilatation 2cm   Effacement 90%   Station -2   Presentation: cephalic FHT:  Baseline rate 140 bpm   Variability moderate  Accelerations present   Decelerations none Contractions: Every 2-4 mins   Labs: Recent Results (from the past 24 hour(s))  CBC   Collection Time   04/26/12  6:05 AM      Component Value Range   WBC 15.3 (*) 4.0 - 10.5 K/uL   RBC 4.09  3.87 - 5.11 MIL/uL   Hemoglobin 10.2 (*) 12.0 - 15.0 g/dL   HCT 95.6 (*) 21.3 - 08.6 %   MCV 78.5  78.0 - 100.0 fL   MCH 24.9 (*) 26.0 - 34.0 pg   MCHC 31.8  30.0 - 36.0 g/dL   RDW 57.8  46.9 - 62.9 %   Platelets 243  150 -  400 K/uL   Imaging Studies:  No results found.  Assessment: TONYA WANTZ is  21 y.o. G2P0010 at [redacted]w[redacted]d presents with probable early labor; R/O GHTN/preeclampsia.  Plan: Continue to observe; monitor b/p and preeclampsia labs  CRESENZO-DISHMAN,Wentworth Edelen 12/13/20136:28 AM

## 2012-04-26 NOTE — Progress Notes (Signed)
Hannah Peters is a 21 y.o. G2P0010 at [redacted]w[redacted]d  admitted for active labor, preeclampsia  Subjective: Feeling uncomfortable with contractions, no epidural yet.  Objective: BP 127/78  Pulse 79  Temp 98.4 F (36.9 C) (Oral)  Resp 18  Ht 5\' 8"  (1.727 m)  Wt 92.987 kg (205 lb)  BMI 31.17 kg/m2  SpO2 96%     Filed Vitals:   04/26/12 1255 04/26/12 1300 04/26/12 1435 04/26/12 1500  BP:  131/74 129/92 127/78  Pulse: 88 81 88 79  Temp:      TempSrc:      Resp:   18 18  Height:    5\' 8"  (1.727 m)  Weight:    92.987 kg (205 lb)  SpO2: 94% 96%       FHT:  FHR: 130 bpm, variability: moderate,  accelerations:  Present,  decelerations:  Absent UC:   regular, every 2-3 minutes  SVE:   Dilation: 4 Effacement (%): 100 Station: +1 Exam by:: Dr Thad Ranger  Labs: Lab Results  Component Value Date   WBC 15.3* 04/26/2012   HGB 10.2* 04/26/2012   HCT 32.1* 04/26/2012   MCV 78.5 04/26/2012   PLT 243 04/26/2012    Assessment / Plan: Protracted latent phase  Labor: protracted latent phase, start pitocin Preeclampsia:  on magnesium sulfate, no severe pressures or symptoms Fetal Wellbeing:  Category I Pain Control:  nubain, epidural when ready I/D:  n/a Anticipated MOD:  NSVD  Hannah Peters 04/26/2012, 3:44 PM

## 2012-04-26 NOTE — Progress Notes (Signed)
Pt delivered viable female with APGARS 9, 9 SVD. Dr Emelda Fear present at delivery.

## 2012-04-27 ENCOUNTER — Encounter (HOSPITAL_COMMUNITY): Payer: Self-pay | Admitting: *Deleted

## 2012-04-27 DIAGNOSIS — O1490 Unspecified pre-eclampsia, unspecified trimester: Secondary | ICD-10-CM

## 2012-04-27 LAB — COMPREHENSIVE METABOLIC PANEL
AST: 50 U/L — ABNORMAL HIGH (ref 0–37)
Alkaline Phosphatase: 195 U/L — ABNORMAL HIGH (ref 39–117)
CO2: 23 mEq/L (ref 19–32)
Chloride: 103 mEq/L (ref 96–112)
Creatinine, Ser: 0.55 mg/dL (ref 0.50–1.10)
GFR calc non Af Amer: >90 mL/min (ref >90)
Potassium: 3.7 mEq/L (ref 3.5–5.1)
Total Bilirubin: 0.4 mg/dL (ref 0.3–1.2)

## 2012-04-27 LAB — CBC
HCT: 28.9 % — ABNORMAL LOW (ref 36.0–46.0)
MCV: 78.5 fL (ref 78.0–100.0)
Platelets: 232 10*3/uL (ref 150–400)
RBC: 3.68 MIL/uL — ABNORMAL LOW (ref 3.87–5.11)
WBC: 18.9 10*3/uL — ABNORMAL HIGH (ref 4.0–10.5)

## 2012-04-27 MED ORDER — LACTATED RINGERS IV SOLN
INTRAVENOUS | Status: DC
  Administered 2012-04-27: 16:00:00 via INTRAVENOUS

## 2012-04-27 NOTE — Progress Notes (Signed)
Post Partum Day 1 on Mag Sulfate for preeclampsia, mild, with elevated lft's Subjective: no complaints, up ad lib and voiding  Objective: Blood pressure 119/78, pulse 88, temperature 98.5 F (36.9 C), temperature source Oral, resp. rate 18, height 5\' 8"  (1.727 m), weight 205 lb (92.987 kg), SpO2 96.00%, unknown if currently breastfeeding.  Physical Exam:  General: alert and cooperative Lochia: appropriate Uterine Fundus: firm Incision: n/a DVT Evaluation: No evidence of DVT seen on physical exam.   Basename 04/27/12 0545 04/26/12 1558  HGB 9.3* 10.1*  HCT 28.9* 31.6*   CMP     Component Value Date/Time   NA 135 04/27/2012 0545   K 3.7 04/27/2012 0545   CL 103 04/27/2012 0545   CO2 23 04/27/2012 0545   GLUCOSE 88 04/27/2012 0545   BUN 5* 04/27/2012 0545   CREATININE 0.55 04/27/2012 0545   CALCIUM 7.6* 04/27/2012 0545   PROT 6.0 04/27/2012 0545   ALBUMIN 2.2* 04/27/2012 0545   AST 50* 04/27/2012 0545   ALT 60* 04/27/2012 0545   ALKPHOS 195* 04/27/2012 0545   BILITOT 0.4 04/27/2012 0545   GFRNONAA >90 04/27/2012 0545   GFRAA >90 04/27/2012 0545   LFT higher...  Assessment/Plan: Postpartum day I s/p preeclampsia, on mag sulfate til 10 pm Routine pp care otherwise.   LOS: 1 day   Lorren Rossetti V 04/27/2012, 7:54 AM

## 2012-04-27 NOTE — Clinical Social Work Note (Signed)
CSW aware of consult.  Awaiting UDS results and will complete assessment with MOB.  [phone] 

## 2012-04-27 NOTE — Anesthesia Postprocedure Evaluation (Signed)
  Anesthesia Post-op Note  Patient: Hannah Peters  Procedure(s) Performed: * No procedures listed *  Patient Location: Antenatal  Anesthesia Type:Epidural  Level of Consciousness: awake, alert  and oriented  Airway and Oxygen Therapy: Patient Spontanous Breathing  Post-op Pain: none  Post-op Assessment: Post-op Vital signs reviewed, Patient's Cardiovascular Status Stable, No headache, No backache, No residual numbness and No residual motor weakness  Post-op Vital Signs: Reviewed and stable  Complications: No apparent anesthesia complications

## 2012-04-28 DIAGNOSIS — IMO0001 Reserved for inherently not codable concepts without codable children: Secondary | ICD-10-CM

## 2012-04-28 LAB — COMPREHENSIVE METABOLIC PANEL
ALT: 48 U/L — ABNORMAL HIGH (ref 0–35)
BUN: 5 mg/dL — ABNORMAL LOW (ref 6–23)
CO2: 25 mEq/L (ref 19–32)
Calcium: 7.8 mg/dL — ABNORMAL LOW (ref 8.4–10.5)
Creatinine, Ser: 0.61 mg/dL (ref 0.50–1.10)
GFR calc Af Amer: >90 mL/min (ref >90)
GFR calc non Af Amer: >90 mL/min (ref >90)
Glucose, Bld: 85 mg/dL (ref 70–99)
Sodium: 137 mEq/L (ref 135–145)
Total Protein: 5 g/dL — ABNORMAL LOW (ref 6.0–8.3)

## 2012-04-28 LAB — CBC
HCT: 25.2 % — ABNORMAL LOW (ref 36.0–46.0)
Hemoglobin: 8 g/dL — ABNORMAL LOW (ref 12.0–15.0)
RBC: 3.16 MIL/uL — ABNORMAL LOW (ref 3.87–5.11)
WBC: 12.1 10*3/uL — ABNORMAL HIGH (ref 4.0–10.5)

## 2012-04-28 MED ORDER — IBUPROFEN 600 MG PO TABS: 600.0000 mg | ORAL_TABLET | Freq: Four times a day (QID) | ORAL | Status: DC

## 2012-04-28 NOTE — Discharge Summary (Signed)
Obstetric Discharge Summary Reason for Admission: onset of labor Prenatal Procedures: Preeclampsia Intrapartum Procedures: spontaneous vaginal delivery Postpartum Procedures: Magnesium for mild pre-E with elevated LFT's (trending down at time of DC) Complications-Operative and Postpartum: 2nd degree perineal laceration Hemoglobin  Date Value Range Status  04/28/2012 8.0* 12.0 - 15.0 g/dL Final     HCT  Date Value Range Status  04/28/2012 25.2* 36.0 - 46.0 % Final    Physical Exam:  General: alert, cooperative and no distress Lochia: appropriate Uterine Fundus: firm DVT Evaluation: No evidence of DVT seen on physical exam.  Discharge Diagnoses: Term Pregnancy-delivered and Preelampsia  Discharge Information: Date: 04/28/2012 Activity: pelvic rest Diet: routine Medications: PNV and Ibuprofen Condition: stable Instructions: refer to practice specific booklet Discharge to: home Follow-up Information    Follow up with [facility].   Contact information:   [address] [place] [address] [phone]         Newborn Data: Live born female  Birth Weight: 8 lb 1.3 oz (3665 g) APGAR: 9, 9  Home with mother. Outpt circ. Breast feeding. Nexplanon planned for contraception.  Hannah Peters, Hannah Peters 04/28/2012, 7:35 AM  Seen and examined Agree with note Wynelle Bourgeois CNM

## 2012-04-28 NOTE — Clinical Social Work Note (Signed)
Clinical Social Work Department PSYCHOSOCIAL ASSESSMENT - MATERNAL/CHILD 04/28/2012  Patient:  Hannah Peters,Hannah Peters  Account Number:  [id number]  Admit Date:  04/26/2012  Childs Name:   Levi Ciaramitaro    Clinical Social Worker:  Seila Liston, LCSW   Date/Time:  04/28/2012 11:00 AM  Date Referred:  04/28/2012   Referral source  Physician     Referred reason  Substance Abuse  Behavioral Health Issues   Other referral source:    I:  FAMILY / HOME ENVIRONMENT Child's legal guardian:  PARENT  Guardian - Name Guardian - Age Guardian - Address  Hannah Peters 21 [address] [place], [place] [address]  Hannah Peters  [address] [place] [address]   Other household support members/support persons Other support:   MOB and FOB report good family support    II  PSYCHOSOCIAL DATA Information Source:  Patient Interview  Financial and Community Resources Employment:   Financial resources:  Medicaid If Medicaid - County:  [place] Other  WIC   School / Grade:   Maternity Care Coordinator / Child Services Coordination / Early Interventions:  Cultural issues impacting care:    III  STRENGTHS Strengths  Adequate Resources  Home prepared for Child (including basic supplies)  Supportive family/friends  Compliance with medical plan   Strength comment:    IV  RISK FACTORS AND CURRENT PROBLEMS Current Problem:  None   Risk Factor & Current Problem Patient Issue Family Issue Risk Factor / Current Problem Comment   N N     V  SOCIAL WORK ASSESSMENT CSW spoke with MOB and FOB at bedside.  MOB reports no emotional concerns at this time.  PTSD and conversion disorder due to trauma several years ago. No concerns during pregnancy, MOB reports emotions more stable during pregnancy.  No medication at this time MOB reports using coping skills to stablize mood and having previous counseling.  MOB and FOB report no concerns with supplies or family support.  MOB reports hx of MJ use,  however none during pregnancy.  CSW explained hospital policy for drug screen and need to follow up with any postive results from [facility]. MOB and FOB were understanding.  Please reconsult CSW if further needs arise.      VI SOCIAL WORK PLAN Social Work Plan  No Further Intervention Required / No Barriers to Discharge   Type of pt/family education:   If child protective services report - county:   If child protective services report - date:   Information/referral to community resources comment:   Other social work plan:    

## 2012-04-28 NOTE — Discharge Summary (Signed)
Attestation of Attending Supervision of Advanced Practitioner (PA/CNM/NP): Evaluation and management procedures were performed by the Advanced Practitioner under my supervision and collaboration.  I have reviewed the Advanced Practitioner's note and chart, and I agree with the management and plan.  Evolet Salminen, MD, FACOG Attending Obstetrician & Gynecologist Faculty Practice, [facility]  

## 2012-04-28 NOTE — Clinical Social Work Note (Signed)
CSW spoke with MOB and FOB in room.  No barriers to discharge at this time.  Full consult report to follow.    [phone] 

## 2012-04-29 NOTE — Progress Notes (Signed)
Post discharge chart review completed.  

## 2012-04-29 NOTE — H&P (Signed)
Attestation of Attending Supervision of Advanced Practitioner (CNM/NP): Evaluation and management procedures were performed by the Advanced Practitioner under my supervision and collaboration.  I have reviewed the Advanced Practitioner's note and chart, and I agree with the management and plan.  Cornellius Kropp 04/29/2012 3:37 PM   

## 2012-04-30 ENCOUNTER — Inpatient Hospital Stay (HOSPITAL_COMMUNITY): Admission: RE | Admit: 2012-04-30 | Payer: Medicaid Other | Source: Ambulatory Visit

## 2012-08-22 ENCOUNTER — Encounter: Payer: Self-pay | Admitting: *Deleted

## 2012-08-22 DIAGNOSIS — F449 Dissociative and conversion disorder, unspecified: Secondary | ICD-10-CM

## 2012-08-22 DIAGNOSIS — Z8619 Personal history of other infectious and parasitic diseases: Secondary | ICD-10-CM

## 2012-08-22 DIAGNOSIS — T7422XA Child sexual abuse, confirmed, initial encounter: Secondary | ICD-10-CM

## 2012-08-22 DIAGNOSIS — F4310 Post-traumatic stress disorder, unspecified: Secondary | ICD-10-CM

## 2012-08-23 ENCOUNTER — Encounter: Payer: Self-pay | Admitting: Obstetrics & Gynecology

## 2012-08-23 ENCOUNTER — Ambulatory Visit (INDEPENDENT_AMBULATORY_CARE_PROVIDER_SITE_OTHER): Payer: Medicaid Other | Admitting: Obstetrics & Gynecology

## 2012-08-23 VITALS — BP 120/84 | Ht 68.0 in | Wt 174.0 lb

## 2012-08-23 DIAGNOSIS — IMO0002 Reserved for concepts with insufficient information to code with codable children: Secondary | ICD-10-CM | POA: Insufficient documentation

## 2012-08-23 MED ORDER — DOXYCYCLINE HYCLATE 50 MG PO CAPS: 100.0000 mg | ORAL_CAPSULE | Freq: Two times a day (BID) | ORAL | Status: DC

## 2012-08-23 NOTE — Progress Notes (Signed)
Patient ID: Hannah Peters, female   DOB: [birth year], 22 y.o.   MRN: [id number] Patient having pain with thrusting not insertion per se, wondering if bladder has dropped since delivery  Exam No bladder relaxation, vulva and vagina normal without lesions or discharge +CMT Uterine tenderness present Adnexa negative  Dyspareunia, secondary to endometitis Cultures done Doxycycline x 2 weeks Follow up prn

## 2012-08-23 NOTE — Patient Instructions (Signed)
Dyspareunia Dyspareunia is pain during sexual intercourse. It is most common in women, but it also happens in men.  CAUSES  Female The pain from this condition is usually felt when anything is put into the vagina, but any part of the genitals may cause pain during sex. Even sitting or wearing pants can cause pain. Sometimes, a cause cannot be found. Some causes of pain during intercourse are:  Infections of the skin around the vagina.  Vaginal infections, such as a yeast, bacterial, or viral infection.  Vaginismus. This is the inability to have anything put in the vagina even when the woman wants it to happen. There is an automatic muscle contraction and pain. The pain of the muscle contraction can be so severe that intercourse is impossible.  Allergic reaction from spermicides, semen, condoms, scented tampons, soaps, douches, and vaginal sprays.  A fluid-filled sac (cyst) on the Bartholin or Skene glands, located at the opening of the vagina.  Scar tissue in the vagina from a surgically enlarged opening (episiotomy) or tearing after delivering a baby.  Vaginal dryness. This is more common in menopause. The normal secretions of the vagina are decreased. Changes in estrogen levels and increased difficulty becoming aroused can cause painful sex. Vaginal dryness can also happen when taking birth control pills.  Thinning of the tissue (atrophy) of the vulva and vagina. This makes the area thinner, smaller, unable to stretch to accommodate a penis, and prone to infection and tearing.  Vulvar vestibulitis or vestibulodynia.This is a condition that causes pain involving the area around the entrance to the vagina.The most common cause in young women is birth control pills.Women with low estrogen levels (postmenopausal women) may also experience this.Other causes include allergic reactions, too many nerve endings, skin conditions, and pelvic muscles that cannot relax.  Vulvar dermatoses. This  includes skin conditions such as lichen sclerosus and lichen planus.  Lack of foreplay to lubricate the vagina. This can cause vaginal dryness.  Noncancerous tumors (fibroids) in the uterus.  Uterus lining tissue growing outside the uterus (endometriosis).  Pregnancy that starts in the fallopian tube (tubal pregnancy).  Pregnancy or breastfeeding your baby. This can cause vaginal dryness.  A tilting or prolapse of the uterus. Prolapse is when weak and stretched muscles around the uterus allow it to fall into the vagina.  Problems with the ovaries, cysts, or scar tissue. This may be worse with certain sexual positions.  Previous surgeries causing adhesions or scar tissue in the vagina or pelvis.  Bladder and intestinal problems.  Psychological problems (such as depression or anxiety). This may make pain worse.  Negative attitudes about sex, experiencing rape, sexual assault, and misinformation about sex. These issues are often related to some types of pain.  Previous pelvic infection, causing scar tissue in the pelvis and on the female organs.  Cyst or tumor on the ovary.  Cancer of the female organs.  Certain medicines.  Medical problems such as diabetes, arthritis, or thyroid disease. Female In men, there are many physical causes of sexual discomfort. Some causes of pain during intercourse are:  Infections of the prostate, bladder, or seminal vesicles. This can cause pain after ejaculation.  An inflamed bladder (interstitial cystitis). This may cause pain from ejaculation.  Gonorrheal infections. This may cause pain during ejaculation.  An inflamed urethra (urethritis) or inflamed prostate (prostatitis). This can make genital stimulation painful or uncomfortable.  Deformities of the penis, such as Peyronie's disease.  A tight foreskin.  Cancer of the female organs.    Psychological problems. This may make pain worse. DIAGNOSIS   Your caregiver will take a history and  have you describe where the pain is located (outside the vagina, in the vagina, in the pelvis). You may be asked when you experience pain, such as with penetration or with thrusting.  Following this, your caregiver will do a physical exam. Let your caregiver know if the exam is too painful.  During the final part of the female exam, your caregiver will feel your uterus and ovaries with one hand on the abdomen and one finger in your vagina. This is a pelvic exam.  Blood tests, a Pap test, cultures for infection, an ultrasound test, and X-rays may be done. You may need to see a specialist for female problems (gynecologist).  Your caregiver may do a CT scan, MRI, or laparoscopy. Laparoscopy is a procedure to look into the pelvis with a lighted tube, through a cut (incision) in the abdomen. TREATMENT  Your caregiver can help you determine the best course of treatment. Sometimes, more testing is done. Continue with the suggested testing until your caregiver feels sure about your diagnosis and how to treat it. Sometimes, it is difficult to find the reason for the pain. The search for the cause and treatment can be frustrating. Treatment often takes several weeks to a few months before you notice any improvement. You may also need to avoid sexual activity until symptoms improve.Continuing to have sex when it hurts can delay healing and actually make the problem worse. The treatment depends on the cause of the pain. Treatment may include:  Medicines such as antibiotics, vaginal or skin creams, hormones, or antidepressants.  Minor or major surgery.  Psychological counseling or group therapy.  Kegel exercises and vaginal dilators to help certain cases of vaginismus (spasms). Do this only if recommended by your caregiver.Kegel exercises can make some problems worse.  Applying lubrication as recommended by your caregiver if you have dryness.  Sex therapy for you and your sex partner. It is common for  the pain to continue after the reason for the pain has been treated. Some reasons for this include a conditioned response. This means the person having the pain becomes so familiar with the pain that the pain continues as a response, even though the cause is removed. Sex therapy can help with this problem. HOME CARE INSTRUCTIONS   Follow your caregiver's instructions about taking medicines, tests, counseling, and follow-up treatment.  Do not use scented tampons, douches, vaginal sprays, or soaps.  Use water-based lubricants for dryness. Oil lubricants can cause irritation.  Do not use spermicides or condoms that irritate you.  Openly discuss with your partner your sexual experience, your desires, foreplay, and different sexual positions for a more comfortable and enjoyable sexual relationship.  Join group sessions for therapy, if needed.  Practice safe sex at all times.  Empty your bladder before having intercourse.  Try different positions during sexual intercourse.  Take over-the-counter pain medicine recommended by your caregiver before having sexual intercourse.  Do not wear pantyhose. Knee-high and thigh-high hose are okay.  Avoid scrubbing your vulva with a washcloth. Wash the area gently and pat dry with a towel. SEEK MEDICAL CARE IF:   You develop vaginal bleeding after sexual intercourse.  You develop a lump at the opening of your vagina, even if it is not painful.  You have abnormal vaginal discharge.  You have vaginal dryness.  You have itching or irritation of the vulva or vagina.  You   develop a rash or reaction to your medicine. SEEK IMMEDIATE MEDICAL CARE IF:   You develop severe abdominal pain during or shortly after sexual intercourse. You could have a ruptured ovarian cyst or ruptured tubal pregnancy.  You have a fever.  You have painful or bloody urination.  You have painful sexual intercourse, and you never had it before.  You pass out after having  sexual intercourse. Document Released: 05/21/2007 Document Revised: 07/24/2011 Document Reviewed: 08/01/2010 Exit[employer] Patient Information 2013 [employer].  

## 2012-08-24 LAB — GC/CHLAMYDIA PROBE AMP
CT Probe RNA: NEGATIVE
GC Probe RNA: NEGATIVE

## 2014-03-16 ENCOUNTER — Encounter: Payer: Self-pay | Admitting: Obstetrics & Gynecology

## 2016-01-12 ENCOUNTER — Encounter: Payer: Medicaid Other | Admitting: Advanced Practice Midwife

## 2016-02-23 ENCOUNTER — Encounter (INDEPENDENT_AMBULATORY_CARE_PROVIDER_SITE_OTHER): Payer: Self-pay | Admitting: Internal Medicine

## 2016-03-07 ENCOUNTER — Encounter (INDEPENDENT_AMBULATORY_CARE_PROVIDER_SITE_OTHER): Payer: Self-pay | Admitting: Internal Medicine

## 2016-03-07 ENCOUNTER — Ambulatory Visit (INDEPENDENT_AMBULATORY_CARE_PROVIDER_SITE_OTHER): Payer: Medicaid Other | Admitting: Internal Medicine

## 2016-03-08 ENCOUNTER — Encounter (INDEPENDENT_AMBULATORY_CARE_PROVIDER_SITE_OTHER): Payer: Self-pay

## 2017-05-31 DIAGNOSIS — M2391 Unspecified internal derangement of right knee: Secondary | ICD-10-CM | POA: Diagnosis not present

## 2017-07-20 DIAGNOSIS — N938 Other specified abnormal uterine and vaginal bleeding: Secondary | ICD-10-CM | POA: Diagnosis not present

## 2017-07-20 DIAGNOSIS — F411 Generalized anxiety disorder: Secondary | ICD-10-CM | POA: Diagnosis not present

## 2017-07-20 DIAGNOSIS — J0101 Acute recurrent maxillary sinusitis: Secondary | ICD-10-CM | POA: Diagnosis not present

## 2017-07-20 DIAGNOSIS — J209 Acute bronchitis, unspecified: Secondary | ICD-10-CM | POA: Diagnosis not present

## 2017-07-20 DIAGNOSIS — Z681 Body mass index (BMI) 19 or less, adult: Secondary | ICD-10-CM | POA: Diagnosis not present

## 2017-09-19 ENCOUNTER — Other Ambulatory Visit (HOSPITAL_COMMUNITY)
Admission: RE | Admit: 2017-09-19 | Discharge: 2017-09-19 | Disposition: A | Discharge status: Home / Self Care | Payer: BLUE CROSS/BLUE SHIELD | Source: Ambulatory Visit | Attending: Advanced Practice Midwife | Admitting: Advanced Practice Midwife

## 2017-09-19 ENCOUNTER — Ambulatory Visit (INDEPENDENT_AMBULATORY_CARE_PROVIDER_SITE_OTHER): Payer: BLUE CROSS/BLUE SHIELD | Admitting: Advanced Practice Midwife

## 2017-09-19 ENCOUNTER — Encounter: Payer: Self-pay | Admitting: Advanced Practice Midwife

## 2017-09-19 VITALS — BP 110/80 | HR 97 | Ht 69.0 in | Wt 148.0 lb

## 2017-09-19 DIAGNOSIS — Z01419 Encounter for gynecological examination (general) (routine) without abnormal findings: Secondary | ICD-10-CM | POA: Diagnosis present

## 2017-09-19 DIAGNOSIS — B9689 Other specified bacterial agents as the cause of diseases classified elsewhere: Secondary | ICD-10-CM

## 2017-09-19 DIAGNOSIS — N760 Acute vaginitis: Secondary | ICD-10-CM | POA: Diagnosis not present

## 2017-09-19 DIAGNOSIS — Z01411 Encounter for gynecological examination (general) (routine) with abnormal findings: Secondary | ICD-10-CM | POA: Insufficient documentation

## 2017-09-19 DIAGNOSIS — F17210 Nicotine dependence, cigarettes, uncomplicated: Secondary | ICD-10-CM | POA: Diagnosis not present

## 2017-09-19 MED ORDER — NORELGESTROMIN-ETH ESTRADIOL 150-35 MCG/24HR TD PTWK: 1.0000 | MEDICATED_PATCH | TRANSDERMAL | 12 refills | Status: DC

## 2017-09-19 MED ORDER — METRONIDAZOLE 0.75 % VA GEL: 1.0000 | Freq: Every day | VAGINAL | 1 refills | Status: DC
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
## 2017-09-20 LAB — CYTOLOGY - PAP
CHLAMYDIA, DNA PROBE: NEGATIVE
DIAGNOSIS: NEGATIVE
NEISSERIA GONORRHEA: NEGATIVE

## 2017-09-27 ENCOUNTER — Encounter: Payer: Self-pay | Admitting: Women's Health

## 2017-09-27 ENCOUNTER — Ambulatory Visit (INDEPENDENT_AMBULATORY_CARE_PROVIDER_SITE_OTHER): Payer: BLUE CROSS/BLUE SHIELD | Admitting: Women's Health

## 2017-09-27 VITALS — BP 120/60 | HR 96 | Ht 69.0 in | Wt 150.2 lb

## 2017-09-27 DIAGNOSIS — Z3046 Encounter for surveillance of implantable subdermal contraceptive: Secondary | ICD-10-CM

## 2017-09-27 NOTE — Patient Instructions (Signed)
Keep the area clean and dry.  You can remove the big bandage in 24 hours, and the small steri-strip bandage in 3-5 days.  A back up method, such as condoms, should be used for two weeks.    

## 2017-09-27 NOTE — Progress Notes (Signed)
   NEXPLANON REMOVAL Patient name: Hannah Peters MRN [id number]  Date of birth: 07/18/1990 Subjective Findings:   VERDELLA Peters is a 27 y.o. G21P1011 Caucasian female being seen today for removal of a Nexplanon. Her Nexplanon was placed 2014.  She desires removal because it is past time. Signed copy of informed consent in chart.   Patient's last menstrual period was 09/16/2017. Last pap5/8/19. Results were:  normal The planned method of family planning is Ortho-Evra patches weekly- has already started Pertinent History Reviewed:   Reviewed past medical,surgical, social, obstetrical and family history.  Reviewed problem list, medications and allergies. Objective Findings & Procedure:    Vitals:   09/27/17 1059  BP: 120/60  Pulse: 96  Weight: 150 lb 3.2 oz (68.1 kg)  Height:  (1.753 m)  Body mass index is 22.18 kg/m.  No results found for this or any previous visit (from the past 24 hour(s)).   Time out was performed.  Nexplanon site identified.  Area prepped in usual sterile fashon. One cc of 2% lidocaine was used to anesthetize the area at the distal end of the implant. A small stab incision was made right beside the implant on the distal portion.  The Nexplanon rod was grasped using hemostats and removed without difficulty.  There was less than 3 cc blood loss. There were no complications.  Steri-strips were applied over the small incision and a pressure bandage was applied.  The patient tolerated the procedure well. Assessment & Plan:   1) Nexplanon removal She was instructed to keep the area clean and dry, remove pressure bandage in 24 hours, and keep insertion site covered with the steri-strip for 3-5 days.   Follow-up PRN problems.  No orders of the defined types were placed in this encounter.   Follow-up: Return in about 3 months (around 12/28/2017) for F/U.  Cheral Marker CNM, [facility] 09/27/2017 11:39 AM

## 2017-10-09 ENCOUNTER — Telehealth: Payer: Self-pay | Admitting: Obstetrics & Gynecology

## 2017-10-09 NOTE — Telephone Encounter (Signed)
Pt called stating that this is her first month using the patch. She states that she used it for 2 weeks and on the 3rd week, 2nd day she started bleeding so she took the patch off and didn't put it back on. Advised that she should not have taken the patch off. Advised to start a new patch after her period finishes and to continue with the new patch as directed even if she starts bleeding again. Pt verbalized understanding.

## 2017-10-18 DIAGNOSIS — K645 Perianal venous thrombosis: Secondary | ICD-10-CM | POA: Diagnosis not present

## 2017-10-18 DIAGNOSIS — L03314 Cellulitis of groin: Secondary | ICD-10-CM | POA: Diagnosis not present

## 2017-12-10 DIAGNOSIS — I4891 Unspecified atrial fibrillation: Secondary | ICD-10-CM | POA: Diagnosis not present

## 2017-12-10 DIAGNOSIS — Z111 Encounter for screening for respiratory tuberculosis: Secondary | ICD-10-CM | POA: Diagnosis not present

## 2017-12-24 DIAGNOSIS — Z79899 Other long term (current) drug therapy: Secondary | ICD-10-CM | POA: Diagnosis not present

## 2017-12-24 DIAGNOSIS — Z79891 Long term (current) use of opiate analgesic: Secondary | ICD-10-CM | POA: Diagnosis not present

## 2017-12-31 ENCOUNTER — Ambulatory Visit: Payer: BLUE CROSS/BLUE SHIELD | Admitting: Women's Health

## 2018-01-15 DIAGNOSIS — M779 Enthesopathy, unspecified: Secondary | ICD-10-CM | POA: Diagnosis not present

## 2018-01-15 DIAGNOSIS — S5012XA Contusion of left forearm, initial encounter: Secondary | ICD-10-CM | POA: Diagnosis not present

## 2018-01-15 DIAGNOSIS — F17200 Nicotine dependence, unspecified, uncomplicated: Secondary | ICD-10-CM | POA: Diagnosis not present

## 2018-01-15 DIAGNOSIS — S6992XA Unspecified injury of left wrist, hand and finger(s), initial encounter: Secondary | ICD-10-CM | POA: Diagnosis not present

## 2018-01-15 DIAGNOSIS — Z79899 Other long term (current) drug therapy: Secondary | ICD-10-CM | POA: Diagnosis not present

## 2018-01-15 DIAGNOSIS — M25532 Pain in left wrist: Secondary | ICD-10-CM | POA: Diagnosis not present

## 2018-01-17 DIAGNOSIS — F0631 Mood disorder due to known physiological condition with depressive features: Secondary | ICD-10-CM | POA: Diagnosis not present

## 2018-01-17 DIAGNOSIS — F411 Generalized anxiety disorder: Secondary | ICD-10-CM | POA: Diagnosis not present

## 2018-03-11 ENCOUNTER — Other Ambulatory Visit: Payer: Self-pay | Admitting: Advanced Practice Midwife

## 2018-07-28 DIAGNOSIS — F17200 Nicotine dependence, unspecified, uncomplicated: Secondary | ICD-10-CM | POA: Diagnosis not present

## 2018-07-28 DIAGNOSIS — R109 Unspecified abdominal pain: Secondary | ICD-10-CM | POA: Diagnosis not present

## 2018-07-28 DIAGNOSIS — R112 Nausea with vomiting, unspecified: Secondary | ICD-10-CM | POA: Diagnosis not present

## 2018-11-28 DIAGNOSIS — L989 Disorder of the skin and subcutaneous tissue, unspecified: Secondary | ICD-10-CM | POA: Diagnosis not present

## 2018-11-28 DIAGNOSIS — K610 Anal abscess: Secondary | ICD-10-CM | POA: Diagnosis not present

## 2019-03-10 DIAGNOSIS — R1110 Vomiting, unspecified: Secondary | ICD-10-CM | POA: Diagnosis not present

## 2019-03-10 DIAGNOSIS — Z20828 Contact with and (suspected) exposure to other viral communicable diseases: Secondary | ICD-10-CM | POA: Diagnosis not present

## 2019-03-10 DIAGNOSIS — R509 Fever, unspecified: Secondary | ICD-10-CM | POA: Diagnosis not present

## 2019-03-25 DIAGNOSIS — Z1159 Encounter for screening for other viral diseases: Secondary | ICD-10-CM | POA: Diagnosis not present

## 2019-03-25 DIAGNOSIS — R05 Cough: Secondary | ICD-10-CM | POA: Diagnosis not present

## 2019-03-25 DIAGNOSIS — F17200 Nicotine dependence, unspecified, uncomplicated: Secondary | ICD-10-CM | POA: Diagnosis not present

## 2019-03-25 DIAGNOSIS — R0609 Other forms of dyspnea: Secondary | ICD-10-CM | POA: Diagnosis not present

## 2019-03-25 DIAGNOSIS — B349 Viral infection, unspecified: Secondary | ICD-10-CM | POA: Diagnosis not present

## 2019-03-25 DIAGNOSIS — R1110 Vomiting, unspecified: Secondary | ICD-10-CM | POA: Diagnosis not present

## 2019-03-25 DIAGNOSIS — M791 Myalgia, unspecified site: Secondary | ICD-10-CM | POA: Diagnosis not present

## 2019-03-25 DIAGNOSIS — J988 Other specified respiratory disorders: Secondary | ICD-10-CM | POA: Diagnosis not present

## 2019-03-25 DIAGNOSIS — R509 Fever, unspecified: Secondary | ICD-10-CM | POA: Diagnosis not present

## 2019-03-25 DIAGNOSIS — Z20828 Contact with and (suspected) exposure to other viral communicable diseases: Secondary | ICD-10-CM | POA: Diagnosis not present

## 2019-03-25 DIAGNOSIS — R079 Chest pain, unspecified: Secondary | ICD-10-CM | POA: Diagnosis not present

## 2019-03-27 DIAGNOSIS — R05 Cough: Secondary | ICD-10-CM | POA: Diagnosis not present

## 2019-03-27 DIAGNOSIS — Z87898 Personal history of other specified conditions: Secondary | ICD-10-CM | POA: Diagnosis not present

## 2019-03-27 DIAGNOSIS — R110 Nausea: Secondary | ICD-10-CM | POA: Diagnosis not present

## 2019-03-27 DIAGNOSIS — R5383 Other fatigue: Secondary | ICD-10-CM | POA: Diagnosis not present

## 2019-03-28 DIAGNOSIS — R110 Nausea: Secondary | ICD-10-CM | POA: Diagnosis not present

## 2019-03-28 DIAGNOSIS — J209 Acute bronchitis, unspecified: Secondary | ICD-10-CM | POA: Diagnosis not present

## 2019-03-28 DIAGNOSIS — R5383 Other fatigue: Secondary | ICD-10-CM | POA: Diagnosis not present

## 2019-03-28 DIAGNOSIS — R05 Cough: Secondary | ICD-10-CM | POA: Diagnosis not present

## 2019-05-26 DIAGNOSIS — H5213 Myopia, bilateral: Secondary | ICD-10-CM | POA: Diagnosis not present

## 2019-09-26 DIAGNOSIS — Z20828 Contact with and (suspected) exposure to other viral communicable diseases: Secondary | ICD-10-CM | POA: Diagnosis not present

## 2019-11-07 DIAGNOSIS — F411 Generalized anxiety disorder: Secondary | ICD-10-CM | POA: Diagnosis not present

## 2019-11-07 DIAGNOSIS — F4310 Post-traumatic stress disorder, unspecified: Secondary | ICD-10-CM | POA: Diagnosis not present

## 2019-11-07 DIAGNOSIS — Z5181 Encounter for therapeutic drug level monitoring: Secondary | ICD-10-CM | POA: Diagnosis not present

## 2019-11-07 DIAGNOSIS — Z79891 Long term (current) use of opiate analgesic: Secondary | ICD-10-CM | POA: Diagnosis not present

## 2019-11-22 DIAGNOSIS — W19XXXA Unspecified fall, initial encounter: Secondary | ICD-10-CM | POA: Diagnosis not present

## 2019-11-22 DIAGNOSIS — F4321 Adjustment disorder with depressed mood: Secondary | ICD-10-CM | POA: Diagnosis not present

## 2019-11-22 DIAGNOSIS — S50812A Abrasion of left forearm, initial encounter: Secondary | ICD-10-CM | POA: Diagnosis not present

## 2019-12-01 ENCOUNTER — Telehealth (HOSPITAL_COMMUNITY): Payer: Self-pay | Admitting: Psychiatry

## 2019-12-01 NOTE — Telephone Encounter (Signed)
Called to schedule NP appt, left VM to return call

## 2019-12-17 DIAGNOSIS — A084 Viral intestinal infection, unspecified: Secondary | ICD-10-CM | POA: Diagnosis not present
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
## 2020-01-01 ENCOUNTER — Encounter (HOSPITAL_COMMUNITY): Payer: Self-pay | Admitting: Psychiatry

## 2020-01-01 ENCOUNTER — Telehealth (INDEPENDENT_AMBULATORY_CARE_PROVIDER_SITE_OTHER): Payer: BC Managed Care – PPO | Admitting: Psychiatry

## 2020-01-01 ENCOUNTER — Other Ambulatory Visit: Payer: Self-pay

## 2020-01-01 DIAGNOSIS — F4310 Post-traumatic stress disorder, unspecified: Secondary | ICD-10-CM

## 2020-01-01 DIAGNOSIS — F1129 Opioid dependence with unspecified opioid-induced disorder: Secondary | ICD-10-CM | POA: Diagnosis not present

## 2020-01-01 DIAGNOSIS — F330 Major depressive disorder, recurrent, mild: Secondary | ICD-10-CM

## 2020-01-05 ENCOUNTER — Ambulatory Visit (INDEPENDENT_AMBULATORY_CARE_PROVIDER_SITE_OTHER): Payer: BC Managed Care – PPO | Admitting: *Deleted

## 2020-01-05 ENCOUNTER — Encounter: Payer: Self-pay | Admitting: *Deleted

## 2020-01-05 VITALS — BP 131/66 | HR 121 | Ht 68.0 in | Wt 134.0 lb

## 2020-01-05 DIAGNOSIS — Z3201 Encounter for pregnancy test, result positive: Secondary | ICD-10-CM

## 2020-01-05 LAB — POCT URINE PREGNANCY: Preg Test, Ur: POSITIVE — AB

## 2020-01-05 MED ORDER — DOXYLAMINE-PYRIDOXINE 10-10 MG PO TBEC: DELAYED_RELEASE_TABLET | ORAL | 1 refills | Status: DC

## 2020-01-05 MED ORDER — PRENATAL PLUS IRON 29-1 MG PO TABS: ORAL_TABLET | ORAL | 12 refills | Status: DC

## 2020-01-05 NOTE — Progress Notes (Signed)
   NURSE VISIT- PREGNANCY CONFIRMATION   SUBJECTIVE:  Hannah Peters is a 29 y.o. G48P1011 female at [redacted]w[redacted]d by certain LMP of Patient's last menstrual period was 11/22/2019 (exact date). Here for pregnancy confirmation.  Home pregnancy test: positive x two.  She reports nausea and vomiting.  She is not taking prenatal vitamins.    OBJECTIVE:  BP 131/66 (BP Location: Right Arm, Patient Position: Sitting, Cuff Size: Normal)   Pulse (!) 121   Ht 5\' 8"  (1.727 m)   Wt 134 lb (60.8 kg)   LMP 11/22/2019 (Exact Date)   BMI 20.37 kg/m   Appears well, in no apparent distress OB History  Gravida Para Term Preterm AB Living  3 1 1   1 1   SAB TAB Ectopic Multiple Live Births  1       1    # Outcome Date GA Lbr Len/2nd Weight Sex Delivery Anes PTL Lv  3 Current           2 Term 04/26/12 [redacted]w[redacted]d 16:31 / 01:28 8 lb 1.3 oz (3.665 kg) M Vag-Spont EPI  LIV     Birth Comments: WNL  1 SAB 2011            Results for orders placed or performed in visit on 01/05/20 (from the past 24 hour(s))  POCT urine pregnancy   Collection Time: 01/05/20  4:00 PM  Result Value Ref Range   Preg Test, Ur Positive (A) Negative    ASSESSMENT: Positive pregnancy test, [redacted]w[redacted]d by LMP    PLAN: Schedule for dating ultrasound in  One week. Prenatal vitamins: note routed to 01/07/20, [facility] to send prescription   Nausea medicines: requested-note routed to Adline Potter, NP to send prescription   OB packet given: Yes  [facility]  01/05/2020 4:01 PM

## 2020-01-05 NOTE — Progress Notes (Signed)
Chart reviewed for nurse visit. Agree with plan of care. Will rx PNV and diglegis Adline Potter, NP 01/05/2020 4:04 PM

## 2020-01-05 NOTE — Addendum Note (Signed)
Addended by: Cyril Mourning A on: 01/05/2020 04:06 PM   Modules accepted: Orders

## 2020-01-12 ENCOUNTER — Other Ambulatory Visit: Payer: Self-pay | Admitting: Women's Health

## 2020-01-12 MED ORDER — BONJESTA 20-20 MG PO TBCR: 1.0000 | EXTENDED_RELEASE_TABLET | Freq: Every day | ORAL | 8 refills | Status: DC

## 2020-01-14 DIAGNOSIS — Z20828 Contact with and (suspected) exposure to other viral communicable diseases: Secondary | ICD-10-CM | POA: Diagnosis not present

## 2020-01-14 DIAGNOSIS — J399 Disease of upper respiratory tract, unspecified: Secondary | ICD-10-CM | POA: Diagnosis not present

## 2020-01-16 ENCOUNTER — Other Ambulatory Visit: Payer: Self-pay | Admitting: Obstetrics & Gynecology

## 2020-01-16 DIAGNOSIS — O3680X0 Pregnancy with inconclusive fetal viability, not applicable or unspecified: Secondary | ICD-10-CM

## 2020-01-20 ENCOUNTER — Ambulatory Visit (INDEPENDENT_AMBULATORY_CARE_PROVIDER_SITE_OTHER): Payer: BC Managed Care – PPO

## 2020-01-20 ENCOUNTER — Other Ambulatory Visit: Payer: Self-pay

## 2020-01-20 DIAGNOSIS — O3680X0 Pregnancy with inconclusive fetal viability, not applicable or unspecified: Secondary | ICD-10-CM | POA: Diagnosis not present

## 2020-01-20 NOTE — Progress Notes (Signed)
[place] 8+3 wks,single IUP with YS,FHR 176 bpm,CRL 19.70 mm,normal ovaries

## 2020-02-05 DIAGNOSIS — Z6822 Body mass index (BMI) 22.0-22.9, adult: Secondary | ICD-10-CM | POA: Diagnosis not present

## 2020-02-05 DIAGNOSIS — F4310 Post-traumatic stress disorder, unspecified: Secondary | ICD-10-CM | POA: Diagnosis not present

## 2020-02-05 DIAGNOSIS — F411 Generalized anxiety disorder: Secondary | ICD-10-CM | POA: Diagnosis not present

## 2020-02-05 DIAGNOSIS — F0631 Mood disorder due to known physiological condition with depressive features: Secondary | ICD-10-CM | POA: Diagnosis not present

## 2020-02-09 ENCOUNTER — Telehealth: Payer: Self-pay | Admitting: Advanced Practice Midwife

## 2020-02-09 NOTE — Telephone Encounter (Signed)
Pt is 11 weeks has a fever 101.2, vomiting, Please call

## 2020-02-09 NOTE — Telephone Encounter (Signed)
Pt has fever, vomiting, chills, lots of stomach pain. Advised to take tylenol for fever and use nausea meds. Also forcing fluids.

## 2020-02-10 DIAGNOSIS — Z20828 Contact with and (suspected) exposure to other viral communicable diseases: Secondary | ICD-10-CM | POA: Diagnosis not present

## 2020-02-10 DIAGNOSIS — R1110 Vomiting, unspecified: Secondary | ICD-10-CM | POA: Diagnosis not present

## 2020-02-10 DIAGNOSIS — O219 Vomiting of pregnancy, unspecified: Secondary | ICD-10-CM | POA: Diagnosis not present

## 2020-02-17 ENCOUNTER — Other Ambulatory Visit: Payer: Self-pay | Admitting: Obstetrics & Gynecology

## 2020-02-17 ENCOUNTER — Encounter: Payer: Self-pay | Admitting: Advanced Practice Midwife

## 2020-02-17 DIAGNOSIS — Z3490 Encounter for supervision of normal pregnancy, unspecified, unspecified trimester: Secondary | ICD-10-CM | POA: Insufficient documentation

## 2020-02-17 DIAGNOSIS — Z3682 Encounter for antenatal screening for nuchal translucency: Secondary | ICD-10-CM

## 2020-02-17 DIAGNOSIS — O09299 Supervision of pregnancy with other poor reproductive or obstetric history, unspecified trimester: Secondary | ICD-10-CM | POA: Insufficient documentation

## 2020-02-18 ENCOUNTER — Ambulatory Visit: Payer: BC Managed Care – PPO | Admitting: *Deleted

## 2020-02-18 ENCOUNTER — Ambulatory Visit (INDEPENDENT_AMBULATORY_CARE_PROVIDER_SITE_OTHER): Payer: BC Managed Care – PPO | Admitting: Advanced Practice Midwife

## 2020-02-18 ENCOUNTER — Encounter: Payer: Self-pay | Admitting: Advanced Practice Midwife

## 2020-02-18 ENCOUNTER — Ambulatory Visit (INDEPENDENT_AMBULATORY_CARE_PROVIDER_SITE_OTHER): Payer: BC Managed Care – PPO

## 2020-02-18 VITALS — BP 131/76 | HR 91 | Wt 150.0 lb

## 2020-02-18 DIAGNOSIS — Z3480 Encounter for supervision of other normal pregnancy, unspecified trimester: Secondary | ICD-10-CM

## 2020-02-18 DIAGNOSIS — Z3682 Encounter for antenatal screening for nuchal translucency: Secondary | ICD-10-CM | POA: Diagnosis not present

## 2020-02-18 DIAGNOSIS — F17200 Nicotine dependence, unspecified, uncomplicated: Secondary | ICD-10-CM | POA: Insufficient documentation

## 2020-02-18 DIAGNOSIS — Z1389 Encounter for screening for other disorder: Secondary | ICD-10-CM

## 2020-02-18 DIAGNOSIS — Z3A12 12 weeks gestation of pregnancy: Secondary | ICD-10-CM | POA: Diagnosis not present

## 2020-02-18 DIAGNOSIS — F1911 Other psychoactive substance abuse, in remission: Secondary | ICD-10-CM

## 2020-02-18 DIAGNOSIS — O09299 Supervision of pregnancy with other poor reproductive or obstetric history, unspecified trimester: Secondary | ICD-10-CM

## 2020-02-18 DIAGNOSIS — F4310 Post-traumatic stress disorder, unspecified: Secondary | ICD-10-CM

## 2020-02-18 DIAGNOSIS — Z3481 Encounter for supervision of other normal pregnancy, first trimester: Secondary | ICD-10-CM | POA: Diagnosis not present

## 2020-02-18 DIAGNOSIS — Z331 Pregnant state, incidental: Secondary | ICD-10-CM

## 2020-02-18 DIAGNOSIS — F319 Bipolar disorder, unspecified: Secondary | ICD-10-CM

## 2020-02-18 LAB — POCT URINALYSIS DIPSTICK OB
Glucose, UA: NEGATIVE
Ketones, UA: NEGATIVE
Leukocytes, UA: NEGATIVE
Nitrite, UA: NEGATIVE

## 2020-02-18 MED ORDER — ASPIRIN 81 MG PO CHEW: 162.0000 mg | CHEWABLE_TABLET | Freq: Every day | ORAL | 7 refills | Status: DC
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
## 2020-02-18 NOTE — Patient Instructions (Signed)
Hannah Peters, I greatly value your feedback.  If you receive a survey following your visit with [place] today, we appreciate you taking the time to fill it out.  Thanks, [name], CNM   [facility] ([address] [place], [place] [address]) Entrance C, located off of [address] Free 24/7 valet parking   Nausea & Vomiting  Have saltine crackers or pretzels by your bed and eat a few bites before you raise your head out of bed in the morning  Eat small frequent meals throughout the day instead of large meals  Drink plenty of fluids throughout the day to stay hydrated, just don't drink a lot of fluids with your meals.  This can make your stomach fill up faster making you feel sick  Do not brush your teeth right after you eat  Products with real ginger are good for nausea, like ginger ale and ginger hard candy Make sure it says made with real ginger!  Sucking on sour candy like lemon heads is also good for nausea  If your prenatal vitamins make you nauseated, take them at night so you will sleep through the nausea  Sea Bands  If you feel like you need medicine for the nausea & vomiting please let us know  If you are unable to keep any fluids or food down please let us know   Constipation  Drink plenty of fluid, preferably water, throughout the day  Eat foods high in fiber such as fruits, vegetables, and grains  Exercise, such as walking, is a good way to keep your bowels regular  Drink warm fluids, especially warm prune juice, or decaf coffee  Eat a 1/2 cup of real oatmeal (not instant), 1/2 cup applesauce, and 1/2-1 cup warm prune juice every day  If needed, you may take Colace (docusate sodium) stool softener once or twice a day to help keep the stool soft.   If you still are having problems with constipation, you may take Miralax once daily as needed to help keep your bowels regular.   Home Blood Pressure Monitoring for Patients   Your  provider has recommended that you check your blood pressure (BP) at least once a week at home. If you do not have a blood pressure cuff at home, one will be provided for you. Contact your provider if you have not received your monitor within 1 week.   Helpful Tips for Accurate Home Blood Pressure Checks   Don't smoke, exercise, or drink caffeine 30 minutes before checking your BP  Use the restroom before checking your BP (a full bladder can raise your pressure)  Relax in a comfortable upright chair  Feet on the ground  Left arm resting comfortably on a flat surface at the level of your heart  Legs uncrossed  Back supported  Sit quietly and don't talk  Place the cuff on your bare arm  Adjust snuggly, so that only two fingertips can fit between your skin and the top of the cuff  Check 2 readings separated by at least one minute  Keep a log of your BP readings  For a visual, please reference this diagram: http://ccnc.care/bpdiagram  Provider Name: [facility]     Phone: [phone]  Zone 1: ALL CLEAR  Continue to monitor your symptoms:   BP reading is less than 140 (top number) or less than 90 (bottom number)   No right upper stomach pain  No headaches or seeing  spots  No feeling nauseated or throwing up  No swelling in face and hands  Zone 2: CAUTION Call your doctor's office for any of the following:   BP reading is greater than 140 (top number) or greater than 90 (bottom number)   Stomach pain under your ribs in the middle or right side  Headaches or seeing spots  Feeling nauseated or throwing up  Swelling in face and hands  Zone 3: EMERGENCY  Seek immediate medical care if you have any of the following:   BP reading is greater than160 (top number) or greater than 110 (bottom number)  Severe headaches not improving with Tylenol  Serious difficulty catching your breath  Any worsening symptoms from Zone 2    First Trimester of Pregnancy The  first trimester of pregnancy is from week 1 until the end of week 12 (months 1 through 3). A week after a sperm fertilizes an egg, the egg will implant on the wall of the uterus. This embryo will begin to develop into a baby. Genes from you and your partner are forming the baby. The female genes determine whether the baby is a boy or a girl. At 6-8 weeks, the eyes and face are formed, and the heartbeat can be seen on ultrasound. At the end of 12 weeks, all the baby's organs are formed.  Now that you are pregnant, you will want to do everything you can to have a healthy baby. Two of the most important things are to get good prenatal care and to follow your health care provider's instructions. Prenatal care is all the medical care you receive before the baby's birth. This care will help prevent, find, and treat any problems during the pregnancy and childbirth. BODY CHANGES Your body goes through many changes during pregnancy. The changes vary from woman to woman.   You may gain or lose a couple of pounds at first.  You may feel sick to your stomach (nauseous) and throw up (vomit). If the vomiting is uncontrollable, call your health care provider.  You may tire easily.  You may develop headaches that can be relieved by medicines approved by your health care provider.  You may urinate more often. Painful urination may mean you have a bladder infection.  You may develop heartburn as a result of your pregnancy.  You may develop constipation because certain hormones are causing the muscles that push waste through your intestines to slow down.  You may develop hemorrhoids or swollen, bulging veins (varicose veins).  Your breasts may begin to grow larger and become tender. Your nipples may stick out more, and the tissue that surrounds them (areola) may become darker.  Your gums may bleed and may be sensitive to brushing and flossing.  Dark spots or blotches (chloasma, mask of pregnancy) may develop on  your face. This will likely fade after the baby is born.  Your menstrual periods will stop.  You may have a loss of appetite.  You may develop cravings for certain kinds of food.  You may have changes in your emotions from day to day, such as being excited to be pregnant or being concerned that something may go wrong with the pregnancy and baby.  You may have more vivid and strange dreams.  You may have changes in your hair. These can include thickening of your hair, rapid growth, and changes in texture. Some women also have hair loss during or after pregnancy, or hair that feels dry or thin. Your hair  will most likely return to normal after your baby is born. WHAT TO EXPECT AT YOUR PRENATAL VISITS During a routine prenatal visit:  You will be weighed to make sure you and the baby are growing normally.  Your blood pressure will be taken.  Your abdomen will be measured to track your baby's growth.  The fetal heartbeat will be listened to starting around week 10 or 12 of your pregnancy.  Test results from any previous visits will be discussed. Your health care provider may ask you:  How you are feeling.  If you are feeling the baby move.  If you have had any abnormal symptoms, such as leaking fluid, bleeding, severe headaches, or abdominal cramping.  If you have any questions. Other tests that may be performed during your first trimester include:  Blood tests to find your blood type and to check for the presence of any previous infections. They will also be used to check for low iron levels (anemia) and Rh antibodies. Later in the pregnancy, blood tests for diabetes will be done along with other tests if problems develop.  Urine tests to check for infections, diabetes, or protein in the urine.  An ultrasound to confirm the proper growth and development of the baby.  An amniocentesis to check for possible genetic problems.  Fetal screens for spina bifida and Down  syndrome.  You may need other tests to make sure you and the baby are doing well. HOME CARE INSTRUCTIONS  Medicines  Follow your health care provider's instructions regarding medicine use. Specific medicines may be either safe or unsafe to take during pregnancy.  Take your prenatal vitamins as directed.  If you develop constipation, try taking a stool softener if your health care provider approves. Diet  Eat regular, well-balanced meals. Choose a variety of foods, such as meat or vegetable-based protein, fish, milk and low-fat dairy products, vegetables, fruits, and whole grain breads and cereals. Your health care provider will help you determine the amount of weight gain that is right for you.  Avoid raw meat and uncooked cheese. These carry germs that can cause birth defects in the baby.  Eating four or five small meals rather than three large meals a day may help relieve nausea and vomiting. If you start to feel nauseous, eating a few soda crackers can be helpful. Drinking liquids between meals instead of during meals also seems to help nausea and vomiting.  If you develop constipation, eat more high-fiber foods, such as fresh vegetables or fruit and whole grains. Drink enough fluids to keep your urine clear or pale yellow. Activity and Exercise  Exercise only as directed by your health care provider. Exercising will help you:  Control your weight.  Stay in shape.  Be prepared for labor and delivery.  Experiencing pain or cramping in the lower abdomen or low back is a good sign that you should stop exercising. Check with your health care provider before continuing normal exercises.  Try to avoid standing for long periods of time. Move your legs often if you must stand in one place for a long time.  Avoid heavy lifting.  Wear low-heeled shoes, and practice good posture.  You may continue to have sex unless your health care provider directs you otherwise. Relief of Pain or  Discomfort  Wear a good support bra for breast tenderness.    Take warm sitz baths to soothe any pain or discomfort caused by hemorrhoids. Use hemorrhoid cream if your health care provider approves.  Rest with your legs elevated if you have leg cramps or low back pain.  If you develop varicose veins in your legs, wear support hose. Elevate your feet for 15 minutes, 3-4 times a day. Limit salt in your diet. Prenatal Care  Schedule your prenatal visits by the twelfth week of pregnancy. They are usually scheduled monthly at first, then more often in the last 2 months before delivery.  Write down your questions. Take them to your prenatal visits.  Keep all your prenatal visits as directed by your health care provider. Safety  Wear your seat belt at all times when driving.  Make a list of emergency phone numbers, including numbers for family, friends, the hospital, and police and fire departments. General Tips  Ask your health care provider for a referral to a local prenatal education class. Begin classes no later than at the beginning of month 6 of your pregnancy.  Ask for help if you have counseling or nutritional needs during pregnancy. Your health care provider can offer advice or refer you to specialists for help with various needs.  Do not use hot tubs, steam rooms, or saunas.  Do not douche or use tampons or scented sanitary pads.  Do not cross your legs for long periods of time.  Avoid cat litter boxes and soil used by cats. These carry germs that can cause birth defects in the baby and possibly loss of the fetus by miscarriage or stillbirth.  Avoid all smoking, herbs, alcohol, and medicines not prescribed by your health care provider. Chemicals in these affect the formation and growth of the baby.  Schedule a dentist appointment. At home, brush your teeth with a soft toothbrush and be gentle when you floss. SEEK MEDICAL CARE IF:   You have dizziness.  You have mild  pelvic cramps, pelvic pressure, or nagging pain in the abdominal area.  You have persistent nausea, vomiting, or diarrhea.  You have a bad smelling vaginal discharge.  You have pain with urination.  You notice increased swelling in your face, hands, legs, or ankles. SEEK IMMEDIATE MEDICAL CARE IF:   You have a fever.  You are leaking fluid from your vagina.  You have spotting or bleeding from your vagina.  You have severe abdominal cramping or pain.  You have rapid weight gain or loss.  You vomit blood or material that looks like coffee grounds.  You are exposed to [place] measles and have never had them.  You are exposed to fifth disease or chickenpox.  You develop a severe headache.  You have shortness of breath.  You have any kind of trauma, such as from a fall or a car accident. Document Released: 04/25/2001 Document Revised: 09/15/2013 Document Reviewed: 03/11/2013 [facility] Patient Information 2015 [place], [place]. This information is not intended to replace advice given to you by your health care provider. Make sure you discuss any questions you have with your health care provider.

## 2020-02-18 NOTE — Progress Notes (Signed)
[place] 12+4 wks,measurements c/w dates,crl 60.52 mm,normal ovaries,NB present,NT 2 mm,fhr 153 bpm,posterior fundal placenta

## 2020-02-18 NOTE — Progress Notes (Signed)
   NURSE VISIT- [facility]  SUBJECTIVE:  Hannah Peters is a 29 y.o. G71P1011 female here for Panorama NIPT. She is [redacted]w[redacted]d pregnant.   OBJECTIVE:  Appears well, in no apparent distress  Blood work drawn from right [facility] without difficulty. 1 attempt(s).   ASSESSMENT: Pregnancy [redacted]w[redacted]d Panorama NIPT  PLAN: [facility] portal information given and instructed patient how to access results  Jobe Marker  02/18/2020 11:38 AM

## 2020-02-20 LAB — CBC/D/PLT+RPR+RH+ABO+RUB AB...
Antibody Screen: NEGATIVE
Basophils Absolute: 0 10*3/uL (ref 0.0–0.2)
Basos: 0 %
EOS (ABSOLUTE): 0 10*3/uL (ref 0.0–0.4)
Eos: 0 %
HCV Ab: <0.1 s/co ratio (ref 0.0–0.9)
HIV Screen 4th Generation wRfx: NONREACTIVE
Hematocrit: 35.7 % (ref 34.0–46.6)
Hemoglobin: 12.2 g/dL (ref 11.1–15.9)
Hepatitis B Surface Ag: NEGATIVE
Immature Grans (Abs): 0.1 10*3/uL (ref 0.0–0.1)
Immature Granulocytes: 1 %
Lymphocytes Absolute: 2.2 10*3/uL (ref 0.7–3.1)
Lymphs: 12 %
MCH: 30.3 pg (ref 26.6–33.0)
MCHC: 34.2 g/dL (ref 31.5–35.7)
MCV: 89 fL (ref 79–97)
Monocytes Absolute: 0.9 10*3/uL (ref 0.1–0.9)
Monocytes: 5 %
Neutrophils Absolute: 15 10*3/uL — ABNORMAL HIGH (ref 1.4–7.0)
Neutrophils: 82 %
Platelets: 336 10*3/uL (ref 150–450)
RBC: 4.02 x10E6/uL (ref 3.77–5.28)
RDW: 13.5 % (ref 11.7–15.4)
RPR Ser Ql: NONREACTIVE
Rh Factor: POSITIVE
Rubella Antibodies, IGG: 5 index (ref >0.99)
WBC: 18.2 10*3/uL — ABNORMAL HIGH (ref 3.4–10.8)

## 2020-02-20 LAB — PMP SCREEN PROFILE (10S), URINE
Amphetamine Scrn, Ur: NEGATIVE ng/mL
BARBITURATE SCREEN URINE: NEGATIVE ng/mL
BENZODIAZEPINE SCREEN, URINE: NEGATIVE ng/mL
CANNABINOIDS UR QL SCN: POSITIVE ng/mL — AB
Cocaine (Metab) Scrn, Ur: NEGATIVE ng/mL
Creatinine(Crt), U: 190.3 mg/dL (ref 20.0–300.0)
Methadone Screen, Urine: NEGATIVE ng/mL
OXYCODONE+OXYMORPHONE UR QL SCN: NEGATIVE ng/mL
Opiate Scrn, Ur: NEGATIVE ng/mL
Ph of Urine: 6.3 (ref 4.5–8.9)
Phencyclidine Qn, Ur: NEGATIVE ng/mL
Propoxyphene Scrn, Ur: NEGATIVE ng/mL

## 2020-02-20 LAB — COMPREHENSIVE METABOLIC PANEL
ALT: 24 IU/L (ref 0–32)
AST: 23 IU/L (ref 0–40)
Albumin/Globulin Ratio: 1.8 (ref 1.2–2.2)
Albumin: 4.4 g/dL (ref 3.9–5.0)
Alkaline Phosphatase: 65 IU/L (ref 44–121)
BUN/Creatinine Ratio: 10 (ref 9–23)
BUN: 6 mg/dL (ref 6–20)
Bilirubin Total: 0.2 mg/dL (ref 0.0–1.2)
CO2: 23 mmol/L (ref 20–29)
Calcium: 9.2 mg/dL (ref 8.7–10.2)
Chloride: 100 mmol/L (ref 96–106)
Creatinine, Ser: 0.6 mg/dL (ref 0.57–1.00)
GFR calc Af Amer: 141 mL/min/{1.73_m2} (ref >59)
GFR calc non Af Amer: 123 mL/min/{1.73_m2} (ref >59)
Globulin, Total: 2.4 g/dL (ref 1.5–4.5)
Glucose: 70 mg/dL (ref 65–99)
Potassium: 3.7 mmol/L (ref 3.5–5.2)
Sodium: 138 mmol/L (ref 134–144)
Total Protein: 6.8 g/dL (ref 6.0–8.5)

## 2020-02-20 LAB — INTEGRATED 1
Crown Rump Length: 60.5 mm
Gest. Age on Collection Date: 12.3 weeks
Maternal Age at EDD: 30.9 yr
Nuchal Translucency (NT): 2 mm
Number of Fetuses: 1
PAPP-A Value: 454.4 ng/mL
Weight: 150 [lb_av]

## 2020-02-20 LAB — HCV INTERPRETATION

## 2020-02-20 LAB — PROTEIN / CREATININE RATIO, URINE
Creatinine, Urine: 175.1 mg/dL
Protein, Ur: 52.7 mg/dL
Protein/Creat Ratio: 301 mg/g creat — ABNORMAL HIGH (ref 0–200)

## 2020-02-20 LAB — URINE CULTURE

## 2020-02-22 LAB — GC/CHLAMYDIA PROBE AMP
Chlamydia trachomatis, NAA: NEGATIVE
Neisseria Gonorrhoeae by PCR: NEGATIVE

## 2020-02-23 ENCOUNTER — Encounter: Payer: Self-pay | Admitting: Advanced Practice Midwife

## 2020-02-23 DIAGNOSIS — F1290 Cannabis use, unspecified, uncomplicated: Secondary | ICD-10-CM | POA: Insufficient documentation

## 2020-03-09 ENCOUNTER — Telehealth: Payer: Self-pay | Admitting: Obstetrics & Gynecology

## 2020-03-09 ENCOUNTER — Ambulatory Visit (INDEPENDENT_AMBULATORY_CARE_PROVIDER_SITE_OTHER): Payer: BC Managed Care – PPO | Admitting: *Deleted

## 2020-03-09 ENCOUNTER — Other Ambulatory Visit: Payer: Self-pay

## 2020-03-09 VITALS — BP 132/71 | HR 107 | Wt 152.0 lb

## 2020-03-09 DIAGNOSIS — Z029 Encounter for administrative examinations, unspecified: Secondary | ICD-10-CM

## 2020-03-09 DIAGNOSIS — Z1389 Encounter for screening for other disorder: Secondary | ICD-10-CM

## 2020-03-09 DIAGNOSIS — Z3A15 15 weeks gestation of pregnancy: Secondary | ICD-10-CM

## 2020-03-09 DIAGNOSIS — Z331 Pregnant state, incidental: Secondary | ICD-10-CM

## 2020-03-09 MED ORDER — BUTALBITAL-APAP-CAFFEINE 50-325-40 MG PO TABS
1.0000 | ORAL_TABLET | Freq: Four times a day (QID) | ORAL | 0 refills | Status: DC | PRN
Start: 2020-03-09 — End: 2020-04-14

## 2020-03-09 NOTE — Progress Notes (Addendum)
   NURSE VISIT- BLOOD PRESSURE CHECK  SUBJECTIVE:  Hannah Peters is a 29 y.o. G96P1011 female here for BP check. She is [redacted]w[redacted]d pregnant    HYPERTENSION ROS:  Pregnant/postpartum:  . Severe headaches that don't go away with tylenol/other medicines: Yes  . Visual changes (seeing spots/double/blurred vision) No  . Severe pain under right breast breast or in center of upper chest No  . Severe nausea/vomiting No  . Taking medicines as instructed not applicable   OBJECTIVE:  LMP 11/22/2019 (Exact Date)   Appearance alert, well appearing, and in no distress and in mild to moderate distress.  ASSESSMENT: Pregnancy [redacted]w[redacted]d  blood pressure check  PLAN: Discussed with Dr. Despina Hidden   Recommendations: new prescription will be sent   Follow-up: as scheduled   Annamarie Dawley  03/09/2020 11:02 AM   Fioricet e prescribed  Attestation of Attending Supervision of Advanced Practitioner (CNM/NP/PA): Evaluation and management procedures were performed by the Advanced Practitioner under my supervision and collaboration. I have reviewed the Advanced Practitioner's note and chart, and I agree with the management and plan.  Rockne Coons MD Attending Physician for the Center for [facility] 03/09/2020 4:13 PM

## 2020-03-17 ENCOUNTER — Encounter: Payer: Self-pay | Admitting: Student

## 2020-03-17 ENCOUNTER — Ambulatory Visit (INDEPENDENT_AMBULATORY_CARE_PROVIDER_SITE_OTHER): Payer: BC Managed Care – PPO | Admitting: Student

## 2020-03-17 VITALS — BP 126/72 | HR 111 | Wt 166.0 lb

## 2020-03-17 DIAGNOSIS — Z3A16 16 weeks gestation of pregnancy: Secondary | ICD-10-CM | POA: Diagnosis not present

## 2020-03-17 DIAGNOSIS — Z1389 Encounter for screening for other disorder: Secondary | ICD-10-CM

## 2020-03-17 DIAGNOSIS — Z331 Pregnant state, incidental: Secondary | ICD-10-CM

## 2020-03-17 DIAGNOSIS — Z3480 Encounter for supervision of other normal pregnancy, unspecified trimester: Secondary | ICD-10-CM

## 2020-03-17 LAB — POCT URINALYSIS DIPSTICK OB
Glucose, UA: NEGATIVE
Ketones, UA: NEGATIVE
Leukocytes, UA: NEGATIVE
Nitrite, UA: NEGATIVE
POC,PROTEIN,UA: NEGATIVE
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
## 2020-03-19 LAB — INTEGRATED 2
AFP MoM: 1.16
Alpha-Fetoprotein: 38.4 ng/mL
Crown Rump Length: 60.5 mm
DIA MoM: 1.27
DIA Value: 201.4 pg/mL
Estriol, Unconjugated: 0.59 ng/mL
Gest. Age on Collection Date: 12.3 weeks
Gestational Age: 16.3 weeks
Maternal Age at EDD: 30.9 yr
Nuchal Translucency (NT): 2 mm
Nuchal Translucency MoM: 1.48
Number of Fetuses: 1
PAPP-A MoM: 0.49
PAPP-A Value: 454.4 ng/mL
Test Results:: NEGATIVE
Weight: 150 [lb_av]
Weight: 150 [lb_av]
hCG MoM: 0.68
hCG Value: 23.6 IU/mL
uE3 MoM: 0.59

## 2020-03-24 ENCOUNTER — Encounter: Payer: Self-pay | Admitting: *Deleted

## 2020-03-24 DIAGNOSIS — Z3480 Encounter for supervision of other normal pregnancy, unspecified trimester: Secondary | ICD-10-CM

## 2020-03-28 DIAGNOSIS — O99352 Diseases of the nervous system complicating pregnancy, second trimester: Secondary | ICD-10-CM | POA: Diagnosis not present

## 2020-03-28 DIAGNOSIS — R519 Headache, unspecified: Secondary | ICD-10-CM | POA: Diagnosis not present

## 2020-03-28 DIAGNOSIS — O1492 Unspecified pre-eclampsia, second trimester: Secondary | ICD-10-CM | POA: Diagnosis not present

## 2020-03-28 DIAGNOSIS — O99891 Other specified diseases and conditions complicating pregnancy: Secondary | ICD-10-CM | POA: Diagnosis not present

## 2020-03-28 DIAGNOSIS — G43809 Other migraine, not intractable, without status migrainosus: Secondary | ICD-10-CM | POA: Diagnosis not present

## 2020-03-28 DIAGNOSIS — Z3A18 18 weeks gestation of pregnancy: Secondary | ICD-10-CM | POA: Diagnosis not present

## 2020-03-28 DIAGNOSIS — Z9049 Acquired absence of other specified parts of digestive tract: Secondary | ICD-10-CM | POA: Diagnosis not present

## 2020-03-28 DIAGNOSIS — Z91040 Latex allergy status: Secondary | ICD-10-CM | POA: Diagnosis not present

## 2020-03-30 ENCOUNTER — Other Ambulatory Visit: Payer: Self-pay | Admitting: Student

## 2020-03-30 DIAGNOSIS — Z3480 Encounter for supervision of other normal pregnancy, unspecified trimester: Secondary | ICD-10-CM

## 2020-03-30 DIAGNOSIS — Z363 Encounter for antenatal screening for malformations: Secondary | ICD-10-CM

## 2020-03-30 NOTE — BH Specialist Note (Deleted)
Integrated Behavioral Health via Telemedicine Video (Caregility) Visit  03/30/2020 Hannah Peters [id number]  Number of Integrated Behavioral Health visits: 1 Session Start time: 1:15***  Session End time: 2:15*** Total time: {IBH Total Time:21014050} minutes  Referring Provider: Cam Peters, CNM Type of Service: Individual, Family, *** Patient/Family location: Home Hannah Peters Provider location: [facility] [employer] [facility]  All persons participating in visit: Patient *** and [facility] Soniya Ashraf [place] ***    I connected with [name] and/or [name]'s {family members:20773} by a video enabled telemedicine application (Caregility) and verified that I am speaking with the correct person using two identifiers.   Discussed confidentiality: {YES/NO:21197}  Confirmed demographics & insurance:  {YES/NO:21197}  I discussed that engaging in this virtual visit, they consent to the provision of behavioral healthcare and the services will be billed under their insurance.   Patient and/or legal guardian expressed understanding and consented to virtual visit: {YES/NO:21197}  PRESENTING CONCERNS: Patient and/or family reports the following symptoms/concerns: *** Duration of problem: ***; Severity of problem: {Mild/Moderate/Severe:20260}  STRENGTHS (Protective Factors/Coping Skills): {CHL AMB BH PROTECTIVE FACTORS/STRENGTHS:[id number]}  ASSESSMENT: Patient currently experiencing ***.    GOALS ADDRESSED: Patient will: 1.  Reduce symptoms of: {IBH Symptoms:21014056}  2.  Increase knowledge and/or ability of: {IBH Patient Tools:21014057}  3.  Demonstrate ability to: {IBH Goals:21014053}   Progress of Goals: {CHL AMB BH PROGRESS TOWARDS EYCXK:4818563149}  INTERVENTIONS: Interventions utilized:  {IBH Interventions:21014054} Standardized Assessments completed & reviewed: {IBH Screening Tools:21014051}   OUTCOME: Patient Response:  ***   PLAN: 1. Follow up with behavioral health clinician on : *** 2. Behavioral recommendations: *** 3. Referral(s): {IBH Referrals:21014055}  I discussed the assessment and treatment plan with the patient and/or parent/guardian. They were provided an opportunity to ask questions and all were answered. They agreed with the plan and demonstrated an understanding of the instructions.   They were advised to call back or seek an in-person evaluation as appropriate.  I discussed that the purpose of this visit is to provide behavioral health care while limiting exposure to the novel coronavirus.  Discussed there is a possibility of technology failure and discussed alternative modes of communication if that failure occurs.  Valetta Close Nigel Wessman

## 2020-03-31 ENCOUNTER — Ambulatory Visit (INDEPENDENT_AMBULATORY_CARE_PROVIDER_SITE_OTHER): Payer: BC Managed Care – PPO

## 2020-03-31 ENCOUNTER — Other Ambulatory Visit: Payer: Self-pay

## 2020-03-31 DIAGNOSIS — Z3480 Encounter for supervision of other normal pregnancy, unspecified trimester: Secondary | ICD-10-CM | POA: Diagnosis not present

## 2020-03-31 DIAGNOSIS — Z3A18 18 weeks gestation of pregnancy: Secondary | ICD-10-CM

## 2020-03-31 DIAGNOSIS — Z363 Encounter for antenatal screening for malformations: Secondary | ICD-10-CM | POA: Diagnosis not present

## 2020-03-31 DIAGNOSIS — F1290 Cannabis use, unspecified, uncomplicated: Secondary | ICD-10-CM

## 2020-03-31 DIAGNOSIS — F1911 Other psychoactive substance abuse, in remission: Secondary | ICD-10-CM

## 2020-03-31 NOTE — Progress Notes (Signed)
[place] 18+4 wks,cephalic,fhr 161 BPM,CX 3.3 cm,posterior placenta gr 0,normal ovaries,svp of fluid 5.6 cm,EFW 269 g 70%,anatomy complete,no obvious abnormalities

## 2020-04-06 ENCOUNTER — Other Ambulatory Visit: Payer: Self-pay

## 2020-04-14 ENCOUNTER — Other Ambulatory Visit: Payer: Self-pay

## 2020-04-14 ENCOUNTER — Ambulatory Visit (INDEPENDENT_AMBULATORY_CARE_PROVIDER_SITE_OTHER): Payer: BC Managed Care – PPO | Admitting: Advanced Practice Midwife

## 2020-04-14 VITALS — BP 138/74 | HR 122 | Wt 170.6 lb

## 2020-04-14 DIAGNOSIS — Z3A20 20 weeks gestation of pregnancy: Secondary | ICD-10-CM

## 2020-04-14 DIAGNOSIS — R319 Hematuria, unspecified: Secondary | ICD-10-CM | POA: Insufficient documentation

## 2020-04-14 DIAGNOSIS — O26899 Other specified pregnancy related conditions, unspecified trimester: Secondary | ICD-10-CM | POA: Insufficient documentation

## 2020-04-14 DIAGNOSIS — O26892 Other specified pregnancy related conditions, second trimester: Secondary | ICD-10-CM

## 2020-04-14 DIAGNOSIS — R519 Headache, unspecified: Secondary | ICD-10-CM | POA: Insufficient documentation

## 2020-04-14 DIAGNOSIS — Z3480 Encounter for supervision of other normal pregnancy, unspecified trimester: Secondary | ICD-10-CM

## 2020-04-14 DIAGNOSIS — Z1389 Encounter for screening for other disorder: Secondary | ICD-10-CM

## 2020-04-14 DIAGNOSIS — F4310 Post-traumatic stress disorder, unspecified: Secondary | ICD-10-CM

## 2020-04-14 DIAGNOSIS — Z331 Pregnant state, incidental: Secondary | ICD-10-CM

## 2020-04-14 LAB — POCT URINALYSIS DIPSTICK OB
Glucose, UA: NEGATIVE
Ketones, UA: NEGATIVE
Leukocytes, UA: NEGATIVE
Nitrite, UA: NEGATIVE
POC,PROTEIN,UA: NEGATIVE

## 2020-04-14 MED ORDER — BUTALBITAL-APAP-CAFFEINE 50-325-40 MG PO TABS
1.0000 | ORAL_TABLET | Freq: Four times a day (QID) | ORAL | 0 refills | Status: DC | PRN
Start: 2020-04-14 — End: 2020-08-13

## 2020-04-14 NOTE — Progress Notes (Addendum)
LOW-RISK PREGNANCY VISIT Patient name: Hannah Peters MRN [id number]  Date of birth: 04-24-1991 Chief Complaint:   Routine Prenatal Visit  History of Present Illness:   Hannah Peters is a 29 y.o. G85P1011 female at [redacted]w[redacted]d with an Estimated Date of Delivery: 08/28/20 being seen today for ongoing management of a low-risk pregnancy.  Today she reports was tx at [facility] for a H/A refractory to Fioricet (given fluids & Benadryl) with good relief; feels dizziness intermittently; started therapy and it's going well. Contractions: Not present. Vag. Bleeding: None.  Movement: Present. denies leaking of fluid. Review of Systems:   Pertinent items are noted in HPI Denies abnormal vaginal discharge w/ itching/odor/irritation, headaches, visual changes, shortness of breath, chest pain, abdominal pain, severe nausea/vomiting, or problems with urination or bowel movements unless otherwise stated above. Pertinent History Reviewed:  Reviewed past medical,surgical, social, obstetrical and family history.  Reviewed problem list, medications and allergies. Physical Assessment:   Vitals:   04/14/20 1608  BP: 138/74  Pulse: (!) 122  Weight: 170 lb 9.6 oz (77.4 kg)  Body mass index is 25.94 kg/m.        Physical Examination:   General appearance: Well appearing, and in no distress  Mental status: Alert, oriented to person, place, and time  Skin: Warm & dry  Cardiovascular: Normal heart rate noted  Respiratory: Normal respiratory effort, no distress  Abdomen: Soft, gravid, nontender  Pelvic: Cervical exam deferred         Extremities: Edema: Trace  Fetal Status: Fetal Heart Rate (bpm): 151   Movement: Present     Anatomy u/s from 03/31/20: [place] 18+4 wks,cephalic,fhr 161 BPM,CX 3.3 cm,posterior placenta gr 0,normal ovaries,svp of fluid 5.6 cm,EFW 269 g 70%,anatomy complete,no obvious abnormalities   Results for orders placed or performed in visit on 04/14/20 (from the past 24 hour(s))  POC Urinalysis  Dipstick OB   Collection Time: 04/14/20  4:04 PM  Result Value Ref Range   Color, UA     Clarity, UA     Glucose, UA Negative Negative   Bilirubin, UA     Ketones, UA neg    Spec Grav, UA     Blood, UA large    pH, UA     POC,PROTEIN,UA Negative Negative, Trace, Small (1+), Moderate (2+), Large (3+), 4+   Urobilinogen, UA     Nitrite, UA neg    Leukocytes, UA Negative Negative   Appearance     Odor      Assessment & Plan:  1) Low-risk pregnancy G3P1011 at [redacted]w[redacted]d with an Estimated Date of Delivery: 08/28/20   2) PTSD/bipolar, on Lexapro and started therapy- going well  3) Headaches, not always helped by Fioricet, will refer to Nada Maclachlan PA  4) Hx pre-e, stable, continue bASA  5) Persistent hematuria, pt states it's due to a kidney stone; denies pain currently   Meds:  Meds ordered this encounter  Medications  . butalbital-acetaminophen-caffeine (FIORICET) 50-325-40 MG tablet    Sig: Take 1-2 tablets by mouth every 6 (six) hours as needed for headache.    Dispense:  20 tablet    Refill:  0    Order Specific Question:   Supervising Provider    Answer:   Lazaro Arms [[id number]]   Labs/procedures today: none  Plan:  Continue routine obstetrical care   Reviewed: Preterm labor symptoms and general obstetric precautions including but not limited to vaginal bleeding, contractions, leaking of fluid and fetal movement were reviewed in  detail with the patient.  All questions were answered. Has home bp cuff. Check bp weekly, let us know if >140/90.   Follow-up: Return in about 4 weeks (around 05/12/2020) for LROB, in person.  Orders Placed This Encounter  Procedures  . POC Urinalysis Dipstick OB   Arabella Merles [facility] 04/14/2020 4:34 PM

## 2020-04-14 NOTE — Patient Instructions (Signed)
Hannah Peters, I greatly value your feedback.  If you receive a survey following your visit with [place] today, we appreciate you taking the time to fill it out.  Thanks, [name] CNM  [facility] ([address] [place], [place] [address]) Entrance C, located off of [address] valet parking  Go to [email or web address] to register for FREE online childbirth classes  [place] Pediatricians/Family Doctors:  [facility] [phone]            [facility] [phone]                 [facility] [phone] (usually not accepting new patients unless you have family there already, you are always welcome to call and ask)       [facility] Department [phone]       [facility] Pediatricians/Family Doctors:   [facility]: [phone]  [facility]: [phone]  [facility]: [phone]  [facility] Doctors:   [facility]: [phone]   [facility]: [phone]  [facility] Doctors:  [facility]: [phone]    Home Blood Pressure Monitoring for Patients   Your provider has recommended that you check your blood pressure (BP) at least once a week at home. If you do not have a blood pressure cuff at home, one will be provided for you. Contact your provider if you have not received your monitor within 1 week.   Helpful Tips for Accurate Home Blood Pressure Checks  . Don't smoke, exercise, or drink caffeine 30 minutes before checking your BP . Use the restroom before checking your BP (a full bladder can raise your pressure) . Relax in a comfortable upright chair . Feet on the ground . Left arm resting comfortably on a flat surface at the level of your heart . Legs uncrossed . Back supported . Sit quietly and don't talk . Place the cuff on your bare arm . Adjust snuggly, so that only  two fingertips can fit between your skin and the top of the cuff . Check 2 readings separated by at least one minute . Keep a log of your BP readings . For a visual, please reference this diagram: http://ccnc.care/bpdiagram  Provider Name: [facility]     Phone: [phone]  Zone 1: ALL CLEAR  Continue to monitor your symptoms:  . BP reading is less than 140 (top number) or less than 90 (bottom number)  . No right upper stomach pain . No headaches or seeing spots . No feeling nauseated or throwing up . No swelling in face and hands  Zone 2: CAUTION Call your doctor's office for any of the following:  . BP reading is greater than 140 (top number) or greater than 90 (bottom number)  . Stomach pain under your ribs in the middle or right side . Headaches or seeing spots . Feeling nauseated or throwing up . Swelling in face and hands  Zone 3: EMERGENCY  Seek immediate medical care if you have any of the following:  . BP reading is greater than160 (top number) or greater than 110 (bottom number) . Severe headaches not improving with Tylenol . Serious difficulty catching your breath . Any worsening symptoms from Zone 2     Second Trimester of Pregnancy The second trimester is from week 14 through week 27 (months 4 through 6). The second trimester is often a time when you feel your best.  Your body has adjusted to being pregnant, and you begin to feel better physically. Usually, morning sickness has lessened or quit completely, you may have more energy, and you may have an increase in appetite. The second trimester is also a time when the fetus is growing rapidly. At the end of the sixth month, the fetus is about 9 inches long and weighs about 1 pounds. You will likely begin to feel the baby move (quickening) between 16 and 20 weeks of pregnancy. Body changes during your second trimester Your body continues to go through many changes during your second trimester. The changes vary  from woman to woman.  Your weight will continue to increase. You will notice your lower abdomen bulging out.  You may begin to get stretch marks on your hips, abdomen, and breasts.  You may develop headaches that can be relieved by medicines. The medicines should be approved by your health care provider.  You may urinate more often because the fetus is pressing on your bladder.  You may develop or continue to have heartburn as a result of your pregnancy.  You may develop constipation because certain hormones are causing the muscles that push waste through your intestines to slow down.  You may develop hemorrhoids or swollen, bulging veins (varicose veins).  You may have back pain. This is caused by: ? Weight gain. ? Pregnancy hormones that are relaxing the joints in your pelvis. ? A shift in weight and the muscles that support your balance.  Your breasts will continue to grow and they will continue to become tender.  Your gums may bleed and may be sensitive to brushing and flossing.  Dark spots or blotches (chloasma, mask of pregnancy) may develop on your face. This will likely fade after the baby is born.  A dark line from your belly button to the pubic area (linea nigra) may appear. This will likely fade after the baby is born.  You may have changes in your hair. These can include thickening of your hair, rapid growth, and changes in texture. Some women also have hair loss during or after pregnancy, or hair that feels dry or thin. Your hair will most likely return to normal after your baby is born.  What to expect at prenatal visits During a routine prenatal visit:  You will be weighed to make sure you and the fetus are growing normally.  Your blood pressure will be taken.  Your abdomen will be measured to track your baby's growth.  The fetal heartbeat will be listened to.  Any test results from the previous visit will be discussed.  Your health care provider may ask  you:  How you are feeling.  If you are feeling the baby move.  If you have had any abnormal symptoms, such as leaking fluid, bleeding, severe headaches, or abdominal cramping.  If you are using any tobacco products, including cigarettes, chewing tobacco, and electronic cigarettes.  If you have any questions.  Other tests that may be performed during your second trimester include:  Blood tests that check for: ? Low iron levels (anemia). ? High blood sugar that affects pregnant women (gestational diabetes) between 14 and 28 weeks. ? Rh antibodies. This is to check for a protein on red blood cells (Rh factor).  Urine tests to check for infections, diabetes, or protein in the urine.  An ultrasound to confirm the proper growth and development of the baby.  An amniocentesis to check for possible genetic problems.  Fetal screens  for spina bifida and Down syndrome.  HIV (human immunodeficiency virus) testing. Routine prenatal testing includes screening for HIV, unless you choose not to have this test.  Follow these instructions at home: Medicines  Follow your health care provider's instructions regarding medicine use. Specific medicines may be either safe or unsafe to take during pregnancy.  Take a prenatal vitamin that contains at least 600 micrograms (mcg) of folic acid.  If you develop constipation, try taking a stool softener if your health care provider approves. Eating and drinking  Eat a balanced diet that includes fresh fruits and vegetables, whole grains, good sources of protein such as meat, eggs, or tofu, and low-fat dairy. Your health care provider will help you determine the amount of weight gain that is right for you.  Avoid raw meat and uncooked cheese. These carry germs that can cause birth defects in the baby.  If you have low calcium intake from food, talk to your health care provider about whether you should take a daily calcium supplement.  Limit foods that  are high in fat and processed sugars, such as fried and sweet foods.  To prevent constipation: ? Drink enough fluid to keep your urine clear or pale yellow. ? Eat foods that are high in fiber, such as fresh fruits and vegetables, whole grains, and beans. Activity  Exercise only as directed by your health care provider. Most women can continue their usual exercise routine during pregnancy. Try to exercise for 30 minutes at least 5 days a week. Stop exercising if you experience uterine contractions.  Avoid heavy lifting, wear low heel shoes, and practice good posture.  A sexual relationship may be continued unless your health care provider directs you otherwise. Relieving pain and discomfort  Wear a good support bra to prevent discomfort from breast tenderness.  Take warm sitz baths to soothe any pain or discomfort caused by hemorrhoids. Use hemorrhoid cream if your health care provider approves.  Rest with your legs elevated if you have leg cramps or low back pain.  If you develop varicose veins, wear support hose. Elevate your feet for 15 minutes, 3-4 times a day. Limit salt in your diet. Prenatal Care  Write down your questions. Take them to your prenatal visits.  Keep all your prenatal visits as told by your health care provider. This is important. Safety  Wear your seat belt at all times when driving.  Make a list of emergency phone numbers, including numbers for family, friends, the hospital, and police and fire departments. General instructions  Ask your health care provider for a referral to a local prenatal education class. Begin classes no later than the beginning of month 6 of your pregnancy.  Ask for help if you have counseling or nutritional needs during pregnancy. Your health care provider can offer advice or refer you to specialists for help with various needs.  Do not use hot tubs, steam rooms, or saunas.  Do not douche or use tampons or scented sanitary  pads.  Do not cross your legs for long periods of time.  Avoid cat litter boxes and soil used by cats. These carry germs that can cause birth defects in the baby and possibly loss of the fetus by miscarriage or stillbirth.  Avoid all smoking, herbs, alcohol, and unprescribed drugs. Chemicals in these products can affect the formation and growth of the baby.  Do not use any products that contain nicotine or tobacco, such as cigarettes and e-cigarettes. If you need help quitting,  ask your health care provider.  Visit your dentist if you have not gone yet during your pregnancy. Use a soft toothbrush to brush your teeth and be gentle when you floss. Contact a health care provider if:  You have dizziness.  You have mild pelvic cramps, pelvic pressure, or nagging pain in the abdominal area.  You have persistent nausea, vomiting, or diarrhea.  You have a bad smelling vaginal discharge.  You have pain when you urinate. Get help right away if:  You have a fever.  You are leaking fluid from your vagina.  You have spotting or bleeding from your vagina.  You have severe abdominal cramping or pain.  You have rapid weight gain or weight loss.  You have shortness of breath with chest pain.  You notice sudden or extreme swelling of your face, hands, ankles, feet, or legs.  You have not felt your baby move in over an hour.  You have severe headaches that do not go away when you take medicine.  You have vision changes. Summary  The second trimester is from week 14 through week 27 (months 4 through 6). It is also a time when the fetus is growing rapidly.  Your body goes through many changes during pregnancy. The changes vary from woman to woman.  Avoid all smoking, herbs, alcohol, and unprescribed drugs. These chemicals affect the formation and growth your baby.  Do not use any tobacco products, such as cigarettes, chewing tobacco, and e-cigarettes. If you need help quitting, ask your  health care provider.  Contact your health care provider if you have any questions. Keep all prenatal visits as told by your health care provider. This is important. This information is not intended to replace advice given to you by your health care provider. Make sure you discuss any questions you have with your health care provider. Document Released: 04/25/2001 Document Revised: 10/07/2015 Document Reviewed: 07/02/2012 Elsevier Interactive Patient Education  2017 [employer].

## 2020-05-13 ENCOUNTER — Ambulatory Visit (INDEPENDENT_AMBULATORY_CARE_PROVIDER_SITE_OTHER): Payer: BC Managed Care – PPO | Admitting: Advanced Practice Midwife

## 2020-05-13 ENCOUNTER — Other Ambulatory Visit: Payer: Self-pay

## 2020-05-13 VITALS — BP 131/61 | HR 94 | Wt 175.0 lb

## 2020-05-13 DIAGNOSIS — Z3482 Encounter for supervision of other normal pregnancy, second trimester: Secondary | ICD-10-CM

## 2020-05-13 DIAGNOSIS — Z3A24 24 weeks gestation of pregnancy: Secondary | ICD-10-CM

## 2020-05-13 DIAGNOSIS — Z331 Pregnant state, incidental: Secondary | ICD-10-CM

## 2020-05-13 DIAGNOSIS — Z1389 Encounter for screening for other disorder: Secondary | ICD-10-CM

## 2020-05-13 LAB — POCT URINALYSIS DIPSTICK OB
Glucose, UA: NEGATIVE
Ketones, UA: NEGATIVE
Leukocytes, UA: NEGATIVE
Nitrite, UA: NEGATIVE

## 2020-05-13 NOTE — Progress Notes (Signed)
LOW-RISK PREGNANCY VISIT Patient name: Hannah Peters MRN [id number]  Date of birth: 07/25/1990 Chief Complaint:   Routine Prenatal Visit  History of Present Illness:   Hannah Peters is a 29 y.o. G31P1011 female at [redacted]w[redacted]d with an Estimated Date of Delivery: 08/28/20 being seen today for ongoing management of a low-risk pregnancy.  Today she reports Firoect helping HAs, never scheduled appt w/KTC. Marland Kitchen Contractions: Not present. Vag. Bleeding: None.  Movement: Present. denies leaking of fluid. Review of Systems:   Pertinent items are noted in HPI Denies abnormal vaginal discharge w/ itching/odor/irritation, headaches, visual changes, shortness of breath, chest pain, abdominal pain, severe nausea/vomiting, or problems with urination or bowel movements unless otherwise stated above. Pertinent History Reviewed:  Reviewed past medical,surgical, social, obstetrical and family history.  Reviewed problem list, medications and allergies. Physical Assessment:   Vitals:   05/13/20 1633  BP: 131/61  Pulse: 94  Weight: 175 lb (79.4 kg)  Body mass index is 26.61 kg/m.        Physical Examination:   General appearance: Well appearing, and in no distress  Mental status: Alert, oriented to person, place, and time  Skin: Warm & dry  Cardiovascular: Normal heart rate noted  Respiratory: Normal respiratory effort, no distress  Abdomen: Soft, gravid, nontender  Pelvic: Cervical exam deferred         Extremities: Edema: Trace  Fetal Status:     Movement: Present    Chaperone: n/a    Results for orders placed or performed in visit on 05/13/20 (from the past 24 hour(s))  POC Urinalysis Dipstick OB   Collection Time: 05/13/20  4:33 PM  Result Value Ref Range   Color, UA     Clarity, UA     Glucose, UA Negative Negative   Bilirubin, UA     Ketones, UA neg    Spec Grav, UA     Blood, UA mod    pH, UA     POC,PROTEIN,UA Trace Negative, Trace, Small (1+), Moderate (2+), Large (3+), 4+    Urobilinogen, UA     Nitrite, UA neg    Leukocytes, UA Negative Negative   Appearance     Odor      Assessment & Plan:  1) Low-risk pregnancy G3P1011 at [redacted]w[redacted]d with an Estimated Date of Delivery: 08/28/20   2) PTSD/bipolar, on Lexapro and going to therapy- going well  3) Headaches, being helped by Fioricet,referred to Nada Maclachlan PA.  Has message in mychart to call to schedule an appointment (encouraged to do so). Discussed rebound HA after d/c'ing fioricet  4) Hx pre-e, stable, continue bASA  5) Persistent hematuria, pt states it's due to a kidney stone; denies pain currently  6)  In recovery:  Asked that no narcotics be offered at all, even if CS (will explore other options if it comes to that)    Meds: No orders of the defined types were placed in this encounter.  Labs/procedures today: none  Plan:  Continue routine obstetrical care  Next visit: prefers in person    Reviewed: Preterm labor symptoms and general obstetric precautions including but not limited to vaginal bleeding, contractions, leaking of fluid and fetal movement were reviewed in detail with the patient.  All questions were answered. Has home bp cuff. Check bp weekly, let us know if >140/90.   Follow-up: Return in about 3 weeks (around 06/03/2020) for PN2/LROB.  Orders Placed This Encounter  Procedures  . POC Urinalysis Dipstick OB  Jacklyn Shell DNP, CNM 05/13/2020 4:44 PM

## 2020-05-13 NOTE — Patient Instructions (Signed)

## 2020-05-15 NOTE — L&D Delivery Note (Signed)
Delivery Note At 8:40 AM a viable female was delivered via Vaginal, Spontaneous (Presentation: Right Occiput Anterior).  APGAR: 8, 9; weight pending .   Placenta status: Spontaneous, Intact.  Cord: 3 vessels with the following complications: None.  Cord pH: NA  Anesthesia: Epidural Episiotomy: None Lacerations: Periurethral;2nd degree repaired Suture Repair: 3.0 monocryl Est. Blood Loss (mL):    Mom to postpartum.  Baby to Couplet care / Skin to Skin.  Hannah Peters 08/12/2020, 9:27 AM

## 2020-06-02 ENCOUNTER — Telehealth: Payer: Self-pay

## 2020-06-02 ENCOUNTER — Encounter: Payer: Self-pay | Admitting: *Deleted

## 2020-06-02 NOTE — Telephone Encounter (Signed)
Patient states she has a sore throat and cough.  She is unsure if she should call us or her PCP.  Informed patient that we do not test for strep, flu or COVID in our office but should reach out to her PCP or go to a testing site.  List of medications sent for her to treat symptoms in the meantime. Advised to let us know if we need to reschedule her appt for Friday.  Pt verbalized understanding.

## 2020-06-02 NOTE — Telephone Encounter (Signed)
Pt stated she believes she may have strep throat and also thinks she has pulled a muscle, pt wanting to be tested for strep and has a appt. On 01/21 for a PN2 and LROB.

## 2020-06-04 ENCOUNTER — Telehealth (INDEPENDENT_AMBULATORY_CARE_PROVIDER_SITE_OTHER): Payer: BC Managed Care – PPO | Admitting: Women's Health

## 2020-06-04 ENCOUNTER — Other Ambulatory Visit: Payer: Self-pay

## 2020-06-04 ENCOUNTER — Other Ambulatory Visit: Payer: BC Managed Care – PPO

## 2020-06-04 ENCOUNTER — Encounter: Payer: Self-pay | Admitting: Women's Health

## 2020-06-04 VITALS — BP 124/73 | HR 102

## 2020-06-04 DIAGNOSIS — Z131 Encounter for screening for diabetes mellitus: Secondary | ICD-10-CM

## 2020-06-04 DIAGNOSIS — Z3482 Encounter for supervision of other normal pregnancy, second trimester: Secondary | ICD-10-CM

## 2020-06-04 DIAGNOSIS — Z3480 Encounter for supervision of other normal pregnancy, unspecified trimester: Secondary | ICD-10-CM | POA: Diagnosis not present

## 2020-06-04 DIAGNOSIS — U071 COVID-19: Secondary | ICD-10-CM

## 2020-06-04 DIAGNOSIS — Z3A27 27 weeks gestation of pregnancy: Secondary | ICD-10-CM

## 2020-06-04 NOTE — Progress Notes (Addendum)
TELEHEALTH VIRTUAL OBSTETRICS VISIT ENCOUNTER NOTE Patient name: Hannah Peters MRN [id number]  Date of birth: [birth year]  I connected with patient on 06/04/20 at  8:50 AM EST by phone (couldn't get mychart to work this morning) and verified that I am speaking with the correct person using two identifiers. Pt is not currently in our office, she is at home.  The provider is in the office.    I discussed the limitations, risks, security and privacy concerns of performing an evaluation and management service by telephone and the availability of in person appointments. I also discussed with the patient that there may be a patient responsible charge related to this service. The patient expressed understanding and agreed to proceed.  Chief Complaint:   Routine Prenatal Visit  History of Present Illness:   Hannah Peters is a 30 y.o. G9P1011 female at [redacted]w[redacted]d with an Estimated Date of Delivery: 08/28/20 being evaluated today for ongoing management of a low-risk pregnancy.  Depression screen PHQ 2/9 02/18/2020  Decreased Interest 3  Down, Depressed, Hopeless 2  PHQ - 2 Score 5  Altered sleeping 3  Tired, decreased energy 3  Change in appetite 1  Feeling bad or failure about yourself  0  Trouble concentrating 1  Moving slowly or fidgety/restless 0  Suicidal thoughts 0  PHQ-9 Score 13    Today she reports covid sx started Tues night (1/18), tested + yesterday. Feels a little worse today, no fever/chills. Not vaccinated. Is interested in monoclonal antibodies if candidate.  Contractions: Not present. Vag. Bleeding: None.  Movement: Present. denies leaking of fluid. Review of Systems:   Pertinent items are noted in HPI Denies abnormal vaginal discharge w/ itching/odor/irritation, headaches, visual changes, shortness of breath, chest pain, abdominal pain, severe nausea/vomiting, or problems with urination or bowel movements unless otherwise stated above. Pertinent History Reviewed:  Reviewed past  medical,surgical, social, obstetrical and family history.  Reviewed problem list, medications and allergies. Physical Assessment:   Vitals:   06/04/20 0838  BP: 124/73  Pulse: (!) 102  There is no height or weight on file to calculate BMI.        Physical Examination:   General:  Alert, oriented and cooperative.   Mental Status: Normal mood and affect perceived. Normal judgment and thought content.  Rest of physical exam deferred due to type of encounter  No results found for this or any previous visit (from the past 24 hour(s)).  Assessment & Plan:  1) Pregnancy G3P1011 at [redacted]w[redacted]d with an Estimated Date of Delivery: 08/28/20   2) Covid, Covid tx referral placed. To stay hydrated, otc relief measures. If severe sob/cp, worsening sx, go to [facility].   3) H/O pre-e> bp's good   Meds: No orders of the defined types were placed in this encounter.   Labs/procedures today: none- was supposed to do pn2-reschedule for next week  Plan:  Continue routine obstetrical care .  Has home bp cuff.  Check bp weekly, let us know if >140/90.  Next visit: prefers will be in person for pn2    Reviewed: Preterm labor symptoms and general obstetric precautions including but not limited to vaginal bleeding, contractions, leaking of fluid and fetal movement were reviewed in detail with the patient. The patient was advised to call back or seek an in-person office evaluation/go to [facility] at [facility] for any urgent or concerning symptoms. All questions were answered. Please refer to After Visit Summary for other counseling recommendations.  I provided 15 minutes of non-face-to-face time during this encounter.  Follow-up: Return in about 1 week (around 06/11/2020) for pn2 (no visit), then 4wks for LROB w/ CNM.  Orders Placed This Encounter  Procedures  . Ambulatory referral for Covid Treatment   Cheral Marker CNM, [facility] 06/04/2020 8:56 AM

## 2020-06-04 NOTE — Patient Instructions (Signed)
Hannah Peters, I greatly value your feedback.  If you receive a survey following your visit with [place] today, we appreciate you taking the time to fill it out.  Thanks, Joellyn Haff, CNM, WHNP-BC   [facility] ([address] [place], [place] [address]) Entrance C, located off of [address] valet parking   You will have your sugar test next visit.  Please do not eat or drink anything after midnight the night before you come, not even water.  You will be here for at least two hours.  Please make an appointment online for the bloodwork at [email or web address] for 8:30am (or as close to this as possible). Make sure you select the [facility] service center. The day of the appointment, check in with our office first, then you will go to [facility] to start the sugar test.    Go to [email or web address] to register for FREE online childbirth classes   Call the office [phone]) or go to [facility] if:  You begin to have strong, frequent contractions  Your water breaks.  Sometimes it is a big gush of fluid, sometimes it is just a trickle that keeps getting your panties wet or running down your legs  You have vaginal bleeding.  It is normal to have a small amount of spotting if your cervix was checked.   You don't feel your baby moving like normal.  If you don't, get you something to eat and drink and lay down and focus on feeling your baby move.  You should feel at least 10 movements in 2 hours.  If you don't, you should call the office or go to [facility].    Tdap Vaccine  It is recommended that you get the Tdap vaccine during the third trimester of EACH pregnancy to help protect your baby from getting pertussis (whooping cough)  27-36 weeks is the BEST time to do this so that you can pass the protection on to your baby. During pregnancy is better than after pregnancy, but if you are unable to get it during pregnancy it will be offered at the hospital.   You  can get this vaccine with [place], at the health department, your family doctor, or some local pharmacies  Everyone who will be around your baby should also be up-to-date on their vaccines before the baby comes. Adults (who are not pregnant) only need 1 dose of Tdap during adulthood.   [place] Pediatricians/Family Doctors:  [facility] [phone]            [facility] [phone]                 [facility] [phone] (usually not accepting new patients unless you have family there already, you are always welcome to call and ask)       [facility] Department [phone]       [facility] Pediatricians/Family Doctors:   [facility]: [phone]  [facility]: [phone]  [facility]: [phone]  [facility] Doctors:   [facility]: [phone]   [facility]: [phone]  [facility] Doctors:  [facility]: [phone]   Home Blood Pressure Monitoring for Patients   Your provider has recommended that you check your blood pressure (BP) at least once a week at home. If you do not have a blood pressure cuff at home, one will be provided for you. Contact your provider if you have  not received your monitor within 1 week.   Helpful Tips for Accurate Home Blood Pressure Checks  . Don't smoke, exercise, or drink caffeine 30 minutes before checking your BP . Use the restroom before checking your BP (a full bladder can raise your pressure) . Relax in a comfortable upright chair . Feet on the ground . Left arm resting comfortably on a flat surface at the level of your heart . Legs uncrossed . Back supported . Sit quietly and don't talk . Place the cuff on your bare arm . Adjust snuggly, so that only two fingertips can fit between your skin and the top of the cuff . Check 2 readings separated by at least one minute . Keep a log  of your BP readings . For a visual, please reference this diagram: http://ccnc.care/bpdiagram  Provider Name: [facility]     Phone: [phone]  Zone 1: ALL CLEAR  Continue to monitor your symptoms:  . BP reading is less than 140 (top number) or less than 90 (bottom number)  . No right upper stomach pain . No headaches or seeing spots . No feeling nauseated or throwing up . No swelling in face and hands  Zone 2: CAUTION Call your doctor's office for any of the following:  . BP reading is greater than 140 (top number) or greater than 90 (bottom number)  . Stomach pain under your ribs in the middle or right side . Headaches or seeing spots . Feeling nauseated or throwing up . Swelling in face and hands  Zone 3: EMERGENCY  Seek immediate medical care if you have any of the following:  . BP reading is greater than160 (top number) or greater than 110 (bottom number) . Severe headaches not improving with Tylenol . Serious difficulty catching your breath . Any worsening symptoms from Zone 2   Third Trimester of Pregnancy The third trimester is from week 29 through week 42, months 7 through 9. The third trimester is a time when the fetus is growing rapidly. At the end of the ninth month, the fetus is about 20 inches in length and weighs 6-10 pounds.  BODY CHANGES Your body goes through many changes during pregnancy. The changes vary from woman to woman.   Your weight will continue to increase. You can expect to gain 25-35 pounds (11-16 kg) by the end of the pregnancy.  You may begin to get stretch marks on your hips, abdomen, and breasts.  You may urinate more often because the fetus is moving lower into your pelvis and pressing on your bladder.  You may develop or continue to have heartburn as a result of your pregnancy.  You may develop constipation because certain hormones are causing the muscles that push waste through your intestines to slow down.  You may develop  hemorrhoids or swollen, bulging veins (varicose veins).  You may have pelvic pain because of the weight gain and pregnancy hormones relaxing your joints between the bones in your pelvis. Backaches may result from overexertion of the muscles supporting your posture.  You may have changes in your hair. These can include thickening of your hair, rapid growth, and changes in texture. Some women also have hair loss during or after pregnancy, or hair that feels dry or thin. Your hair will most likely return to normal after your baby is born.  Your breasts will continue to grow and be tender. A yellow discharge may leak from your breasts called colostrum.  Your belly button may  stick out.  You may feel short of breath because of your expanding uterus.  You may notice the fetus "dropping," or moving lower in your abdomen.  You may have a bloody mucus discharge. This usually occurs a few days to a week before labor begins.  Your cervix becomes thin and soft (effaced) near your due date. WHAT TO EXPECT AT YOUR PRENATAL EXAMS  You will have prenatal exams every 2 weeks until week 36. Then, you will have weekly prenatal exams. During a routine prenatal visit:  You will be weighed to make sure you and the fetus are growing normally.  Your blood pressure is taken.  Your abdomen will be measured to track your baby's growth.  The fetal heartbeat will be listened to.  Any test results from the previous visit will be discussed.  You may have a cervical check near your due date to see if you have effaced. At around 36 weeks, your caregiver will check your cervix. At the same time, your caregiver will also perform a test on the secretions of the vaginal tissue. This test is to determine if a type of bacteria, Group B streptococcus, is present. Your caregiver will explain this further. Your caregiver may ask you:  What your birth plan is.  How you are feeling.  If you are feeling the baby  move.  If you have had any abnormal symptoms, such as leaking fluid, bleeding, severe headaches, or abdominal cramping.  If you have any questions. Other tests or screenings that may be performed during your third trimester include:  Blood tests that check for low iron levels (anemia).  Fetal testing to check the health, activity level, and growth of the fetus. Testing is done if you have certain medical conditions or if there are problems during the pregnancy. FALSE LABOR You may feel small, irregular contractions that eventually go away. These are called Braxton Hicks contractions, or false labor. Contractions may last for hours, days, or even weeks before true labor sets in. If contractions come at regular intervals, intensify, or become painful, it is best to be seen by your caregiver.  SIGNS OF LABOR   Menstrual-like cramps.  Contractions that are 5 minutes apart or less.  Contractions that start on the top of the uterus and spread down to the lower abdomen and back.  A sense of increased pelvic pressure or back pain.  A watery or bloody mucus discharge that comes from the vagina. If you have any of these signs before the 37th week of pregnancy, call your caregiver right away. You need to go to the hospital to get checked immediately. HOME CARE INSTRUCTIONS   Avoid all smoking, herbs, alcohol, and unprescribed drugs. These chemicals affect the formation and growth of the baby.  Follow your caregiver's instructions regarding medicine use. There are medicines that are either safe or unsafe to take during pregnancy.  Exercise only as directed by your caregiver. Experiencing uterine cramps is a good sign to stop exercising.  Continue to eat regular, healthy meals.  Wear a good support bra for breast tenderness.  Do not use hot tubs, steam rooms, or saunas.  Wear your seat belt at all times when driving.  Avoid raw meat, uncooked cheese, cat litter boxes, and soil used by  cats. These carry germs that can cause birth defects in the baby.  Take your prenatal vitamins.  Try taking a stool softener (if your caregiver approves) if you develop constipation. Eat more high-fiber foods, such as  fresh vegetables or fruit and whole grains. Drink plenty of fluids to keep your urine clear or pale yellow.  Take warm sitz baths to soothe any pain or discomfort caused by hemorrhoids. Use hemorrhoid cream if your caregiver approves.  If you develop varicose veins, wear support hose. Elevate your feet for 15 minutes, 3-4 times a day. Limit salt in your diet.  Avoid heavy lifting, wear low heal shoes, and practice good posture.  Rest a lot with your legs elevated if you have leg cramps or low back pain.  Visit your dentist if you have not gone during your pregnancy. Use a soft toothbrush to brush your teeth and be gentle when you floss.  A sexual relationship may be continued unless your caregiver directs you otherwise.  Do not travel far distances unless it is absolutely necessary and only with the approval of your caregiver.  Take prenatal classes to understand, practice, and ask questions about the labor and delivery.  Make a trial run to the hospital.  Pack your hospital bag.  Prepare the baby's nursery.  Continue to go to all your prenatal visits as directed by your caregiver. SEEK MEDICAL CARE IF:  You are unsure if you are in labor or if your water has broken.  You have dizziness.  You have mild pelvic cramps, pelvic pressure, or nagging pain in your abdominal area.  You have persistent nausea, vomiting, or diarrhea.  You have a bad smelling vaginal discharge.  You have pain with urination. SEEK IMMEDIATE MEDICAL CARE IF:   You have a fever.  You are leaking fluid from your vagina.  You have spotting or bleeding from your vagina.  You have severe abdominal cramping or pain.  You have rapid weight loss or gain.  You have shortness of breath  with chest pain.  You notice sudden or extreme swelling of your face, hands, ankles, feet, or legs.  You have not felt your baby move in over an hour.  You have severe headaches that do not go away with medicine.  You have vision changes. Document Released: 04/25/2001 Document Revised: 05/06/2013 Document Reviewed: 07/02/2012 [facility] Patient Information 2015 [place], [place]. This information is not intended to replace advice given to you by your health care provider. Make sure you discuss any questions you have with your health care provider.

## 2020-06-07 ENCOUNTER — Telehealth: Payer: Self-pay | Admitting: Adult Health

## 2020-06-07 NOTE — Telephone Encounter (Signed)
Called to discuss with patient about COVID-19 symptoms and the use of one of the available treatments for those with mild to moderate Covid symptoms and at a high risk of hospitalization.  Pt appears to qualify for outpatient treatment due to co-morbid conditions and/or a member of an at-risk group in accordance with the FDA Emergency Use Authorization.    Symptom onset: 1/18 Vaccinated: No Booster? No  Immunocompromised? No  Qualifiers: Preg /Unvaccinated   Unable to reach pt - Tried to call , voicemail full . Last day for MAB eligible is 06/08/20   Amariana Mirando NP-C

## 2020-06-09 ENCOUNTER — Encounter (HOSPITAL_COMMUNITY): Payer: Self-pay | Admitting: Obstetrics & Gynecology

## 2020-06-09 ENCOUNTER — Inpatient Hospital Stay (HOSPITAL_COMMUNITY)
Admission: AD | Admit: 2020-06-09 | Discharge: 2020-06-09 | Disposition: A | Discharge status: Home / Self Care | Payer: BC Managed Care – PPO | Attending: Obstetrics & Gynecology | Admitting: Obstetrics & Gynecology

## 2020-06-09 ENCOUNTER — Other Ambulatory Visit: Payer: Self-pay

## 2020-06-09 DIAGNOSIS — F1290 Cannabis use, unspecified, uncomplicated: Secondary | ICD-10-CM

## 2020-06-09 DIAGNOSIS — O99323 Drug use complicating pregnancy, third trimester: Secondary | ICD-10-CM | POA: Diagnosis not present

## 2020-06-09 DIAGNOSIS — R519 Headache, unspecified: Secondary | ICD-10-CM

## 2020-06-09 DIAGNOSIS — Z888 Allergy status to other drugs, medicaments and biological substances status: Secondary | ICD-10-CM | POA: Insufficient documentation

## 2020-06-09 DIAGNOSIS — U071 COVID-19: Secondary | ICD-10-CM | POA: Diagnosis not present

## 2020-06-09 DIAGNOSIS — Z3A28 28 weeks gestation of pregnancy: Secondary | ICD-10-CM | POA: Diagnosis not present

## 2020-06-09 DIAGNOSIS — O26893 Other specified pregnancy related conditions, third trimester: Secondary | ICD-10-CM

## 2020-06-09 DIAGNOSIS — Z91040 Latex allergy status: Secondary | ICD-10-CM | POA: Insufficient documentation

## 2020-06-09 DIAGNOSIS — Z7982 Long term (current) use of aspirin: Secondary | ICD-10-CM | POA: Diagnosis not present

## 2020-06-09 DIAGNOSIS — O99333 Smoking (tobacco) complicating pregnancy, third trimester: Secondary | ICD-10-CM | POA: Insufficient documentation

## 2020-06-09 DIAGNOSIS — R102 Pelvic and perineal pain unspecified side: Secondary | ICD-10-CM

## 2020-06-09 DIAGNOSIS — F17210 Nicotine dependence, cigarettes, uncomplicated: Secondary | ICD-10-CM | POA: Diagnosis not present

## 2020-06-09 DIAGNOSIS — Z3480 Encounter for supervision of other normal pregnancy, unspecified trimester: Secondary | ICD-10-CM

## 2020-06-09 DIAGNOSIS — Z3689 Encounter for other specified antenatal screening: Secondary | ICD-10-CM

## 2020-06-09 DIAGNOSIS — E860 Dehydration: Secondary | ICD-10-CM

## 2020-06-09 DIAGNOSIS — F1911 Other psychoactive substance abuse, in remission: Secondary | ICD-10-CM | POA: Diagnosis not present

## 2020-06-09 DIAGNOSIS — O99891 Other specified diseases and conditions complicating pregnancy: Secondary | ICD-10-CM | POA: Insufficient documentation

## 2020-06-09 DIAGNOSIS — O99283 Endocrine, nutritional and metabolic diseases complicating pregnancy, third trimester: Secondary | ICD-10-CM

## 2020-06-09 DIAGNOSIS — O26892 Other specified pregnancy related conditions, second trimester: Secondary | ICD-10-CM

## 2020-06-09 DIAGNOSIS — R319 Hematuria, unspecified: Secondary | ICD-10-CM

## 2020-06-09 DIAGNOSIS — M549 Dorsalgia, unspecified: Secondary | ICD-10-CM | POA: Diagnosis not present

## 2020-06-09 DIAGNOSIS — O98513 Other viral diseases complicating pregnancy, third trimester: Secondary | ICD-10-CM | POA: Diagnosis not present

## 2020-06-09 DIAGNOSIS — Z79899 Other long term (current) drug therapy: Secondary | ICD-10-CM | POA: Insufficient documentation

## 2020-06-09 DIAGNOSIS — R1032 Left lower quadrant pain: Secondary | ICD-10-CM | POA: Insufficient documentation

## 2020-06-09 DIAGNOSIS — O26899 Other specified pregnancy related conditions, unspecified trimester: Secondary | ICD-10-CM

## 2020-06-09 LAB — CBC WITH DIFFERENTIAL/PLATELET
Abs Immature Granulocytes: 0.18 10*3/uL — ABNORMAL HIGH (ref 0.00–0.07)
Basophils Absolute: 0 10*3/uL (ref 0.0–0.1)
Basophils Relative: 0 %
Eosinophils Absolute: 0 10*3/uL (ref 0.0–0.5)
Eosinophils Relative: 0 %
HCT: 31.9 % — ABNORMAL LOW (ref 36.0–46.0)
Hemoglobin: 10.7 g/dL — ABNORMAL LOW (ref 12.0–15.0)
Immature Granulocytes: 1 %
Lymphocytes Relative: 15 %
Lymphs Abs: 2.4 10*3/uL (ref 0.7–4.0)
MCH: 27.6 pg (ref 26.0–34.0)
MCHC: 33.5 g/dL (ref 30.0–36.0)
MCV: 82.2 fL (ref 80.0–100.0)
Monocytes Absolute: 0.7 10*3/uL (ref 0.1–1.0)
Monocytes Relative: 4 %
Neutro Abs: 13.2 10*3/uL — ABNORMAL HIGH (ref 1.7–7.7)
Neutrophils Relative %: 80 %
Platelets: 253 10*3/uL (ref 150–400)
RBC: 3.88 MIL/uL (ref 3.87–5.11)
RDW: 14.6 % (ref 11.5–15.5)
WBC: 16.5 10*3/uL — ABNORMAL HIGH (ref 4.0–10.5)
nRBC: 0 % (ref 0.0–0.2)

## 2020-06-09 LAB — COMPREHENSIVE METABOLIC PANEL
ALT: 17 U/L (ref 0–44)
AST: 17 U/L (ref 15–41)
Albumin: 2.8 g/dL — ABNORMAL LOW (ref 3.5–5.0)
Alkaline Phosphatase: 81 U/L (ref 38–126)
Anion gap: 9 (ref 5–15)
BUN: 5 mg/dL — ABNORMAL LOW (ref 6–20)
CO2: 23 mmol/L (ref 22–32)
Calcium: 8.2 mg/dL — ABNORMAL LOW (ref 8.9–10.3)
Chloride: 106 mmol/L (ref 98–111)
Creatinine, Ser: 0.45 mg/dL (ref 0.44–1.00)
GFR, Estimated: >60 mL/min (ref >60)
Glucose, Bld: 88 mg/dL (ref 70–99)
Potassium: 3.3 mmol/L — ABNORMAL LOW (ref 3.5–5.1)
Sodium: 138 mmol/L (ref 135–145)
Total Bilirubin: 0.6 mg/dL (ref 0.3–1.2)
Total Protein: 6.2 g/dL — ABNORMAL LOW (ref 6.5–8.1)

## 2020-06-09 LAB — URINALYSIS, ROUTINE W REFLEX MICROSCOPIC
Bilirubin Urine: NEGATIVE
Glucose, UA: NEGATIVE mg/dL
Ketones, ur: 5 mg/dL — AB
Nitrite: NEGATIVE
Protein, ur: 100 mg/dL — AB
Specific Gravity, Urine: 1.023 (ref 1.005–1.030)
pH: 5 (ref 5.0–8.0)

## 2020-06-09 LAB — PROTEIN / CREATININE RATIO, URINE
Creatinine, Urine: 388.84 mg/dL
Protein Creatinine Ratio: 0.22 mg/mg{Cre} — ABNORMAL HIGH (ref 0.00–0.15)
Total Protein, Urine: 86 mg/dL

## 2020-06-09 MED ORDER — ACETAMINOPHEN 500 MG PO TABS
1000.0000 mg | ORAL_TABLET | Freq: Once | ORAL | Status: AC
  Administered 2020-06-09: 1000 mg via ORAL
  Filled 2020-06-09: qty 2

## 2020-06-09 MED ORDER — CYCLOBENZAPRINE HCL 5 MG PO TABS
10.0000 mg | ORAL_TABLET | Freq: Once | ORAL | Status: AC
  Administered 2020-06-09: 10 mg via ORAL
  Filled 2020-06-09: qty 2

## 2020-06-09 MED ORDER — LACTATED RINGERS IV BOLUS
2000.0000 mL | Freq: Once | INTRAVENOUS | Status: AC
  Administered 2020-06-09: 2000 mL via INTRAVENOUS

## 2020-06-09 NOTE — Discharge Instructions (Signed)
Round Ligament Pain  The round ligament is a cord of muscle and tissue that helps support the uterus. It can become a source of pain during pregnancy if it becomes stretched or twisted as the baby grows. The pain usually begins in the second trimester (13-28 weeks) of pregnancy, and it can come and go until the baby is delivered. It is not a serious problem, and it does not cause harm to the baby. Round ligament pain is usually a short, sharp, and pinching pain, but it can also be a dull, lingering, and aching pain. The pain is felt in the lower side of the abdomen or in the groin. It usually starts deep in the groin and moves up to the outside of the hip area. The pain may occur when you:  Suddenly change position, such as quickly going from a sitting to standing position.  Roll over in bed.  Cough or sneeze.  Do physical activity. Follow these instructions at home:  Watch your condition for any changes.  When the pain starts, relax. Then try any of these methods to help with the pain: ? Sitting down. ? Flexing your knees up to your abdomen. ? Lying on your side with one pillow under your abdomen and another pillow between your legs. ? Sitting in a warm bath for 15-20 minutes or until the pain goes away.  Take over-the-counter and prescription medicines only as told by your health care provider.  Move slowly when you sit down or stand up.  Avoid long walks if they cause pain.  Stop or reduce your physical activities if they cause pain.  Keep all follow-up visits as told by your health care provider. This is important.   Contact a health care provider if:  Your pain does not go away with treatment.  You feel pain in your back that you did not have before.  Your medicine is not helping. Get help right away if:  You have a fever or chills.  You develop uterine contractions.  You have vaginal bleeding.  You have nausea or vomiting.  You have diarrhea.  You have pain  when you urinate. Summary  Round ligament pain is felt in the lower abdomen or groin. It is usually a short, sharp, and pinching pain. It can also be a dull, lingering, and aching pain.  This pain usually begins in the second trimester (13-28 weeks). It occurs because the uterus is stretching with the growing baby, and it is not harmful to the baby.  You may notice the pain when you suddenly change position, when you cough or sneeze, or during physical activity.  Relaxing, flexing your knees to your abdomen, lying on one side, or taking a warm bath may help to get rid of the pain.  Get help from your health care provider if the pain does not go away or if you have vaginal bleeding, nausea, vomiting, diarrhea, or painful urination. This information is not intended to replace advice given to you by your health care provider. Make sure you discuss any questions you have with your health care provider. Document Revised: 10/17/2017 Document Reviewed: 10/17/2017 Elsevier Patient Education  2021 [employer].  Round Ligament Massage & Stretches  Massage: Starting at the middle of your pubic bone, trace little circles in a wide U from your pubic bone to your hip bones on both sides.  Then starting just above your pubic bone, press in and down, alternating sides to create a gentle rocking of your   uterus back and forth.  Move your hands up the sides of your belly and back down. Do this 3-5 times upon waking and before bed.  Stretches: Get on hands and knees and alternate arching your back deeply while inhaling, and then rounding your back while exhaling. Modified runners lunge:  - Sit on a chair with half of your bottom on the chair and half off.  - Sit up tall, plant your front foot, and stretch your other foot out behind you.  - Breathe deeply for 5 breaths and then do the other side.  
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
## 2020-06-09 NOTE — MAU Note (Signed)
Presents with c/o lower abdominal and back pain located on the left side only.  Reports pain initially began as cramping, but is now stabbing and constant.  Denies VB or LOF.  Endorses +FM.

## 2020-06-11 ENCOUNTER — Other Ambulatory Visit: Payer: BC Managed Care – PPO

## 2020-06-11 ENCOUNTER — Telehealth: Payer: Self-pay | Admitting: Obstetrics & Gynecology

## 2020-06-11 ENCOUNTER — Other Ambulatory Visit: Payer: Self-pay

## 2020-06-11 DIAGNOSIS — Z3A28 28 weeks gestation of pregnancy: Secondary | ICD-10-CM

## 2020-06-11 DIAGNOSIS — Z3480 Encounter for supervision of other normal pregnancy, unspecified trimester: Secondary | ICD-10-CM

## 2020-06-11 DIAGNOSIS — Z131 Encounter for screening for diabetes mellitus: Secondary | ICD-10-CM

## 2020-06-11 NOTE — Telephone Encounter (Signed)
Pt went to [facility] department of [facility] Wednesday morning, was checked by a midwife that told the patient she's messed up the muscle that holds in her uterus Pt states she's having difficulty walking, pressure, feels baby may fall out States she's unable to adequately do her job Feels she needs to be checked - please advise

## 2020-06-11 NOTE — Telephone Encounter (Signed)
Patient states she is still having some pressure when she stands and walks and was advised to purchase a support band and has done so.  Advised in the meantime, she could wear tight fitting clothes to help with the support.  Denies contractions or cramping.  I did encourage her to push more fluids and if pressure became more intense or she developed contractions, to go to the hospital.  Letter sent in [place] for lifting restrictions at work.  Pt verbalized understanding.

## 2020-06-12 LAB — ANTIBODY SCREEN: Antibody Screen: NEGATIVE

## 2020-06-12 LAB — GLUCOSE TOLERANCE, 2 HOURS W/ 1HR
Glucose, 1 hour: 97 mg/dL (ref 65–179)
Glucose, 2 hour: 62 mg/dL — ABNORMAL LOW (ref 65–152)
Glucose, Fasting: 85 mg/dL (ref 65–91)

## 2020-06-12 LAB — RPR: RPR Ser Ql: NONREACTIVE

## 2020-06-12 LAB — HIV ANTIBODY (ROUTINE TESTING W REFLEX): HIV Screen 4th Generation wRfx: NONREACTIVE

## 2020-06-25 ENCOUNTER — Ambulatory Visit (INDEPENDENT_AMBULATORY_CARE_PROVIDER_SITE_OTHER): Payer: BC Managed Care – PPO | Admitting: Obstetrics & Gynecology

## 2020-06-25 ENCOUNTER — Encounter: Payer: Self-pay | Admitting: Obstetrics & Gynecology

## 2020-06-25 ENCOUNTER — Other Ambulatory Visit: Payer: Self-pay

## 2020-06-25 VITALS — BP 117/75 | HR 110 | Wt 176.0 lb

## 2020-06-25 DIAGNOSIS — M62830 Muscle spasm of back: Secondary | ICD-10-CM | POA: Diagnosis not present

## 2020-06-25 DIAGNOSIS — Z3A30 30 weeks gestation of pregnancy: Secondary | ICD-10-CM

## 2020-06-25 DIAGNOSIS — M545 Low back pain, unspecified: Secondary | ICD-10-CM

## 2020-06-25 DIAGNOSIS — Z1389 Encounter for screening for other disorder: Secondary | ICD-10-CM

## 2020-06-25 LAB — POCT URINALYSIS DIPSTICK OB
Glucose, UA: NEGATIVE
Nitrite, UA: NEGATIVE

## 2020-06-25 MED ORDER — CYCLOBENZAPRINE HCL 10 MG PO TABS: 10.0000 mg | ORAL_TABLET | Freq: Three times a day (TID) | ORAL | 1 refills | Status: DC | PRN

## 2020-06-25 NOTE — Progress Notes (Signed)
Work in appointment for acute onset right sided back pain  LOW-RISK PREGNANCY VISIT Patient name: Hannah Peters MRN [id number]  Date of birth: 1991-02-26 Chief Complaint:   Routine Prenatal Visit and Back Pain  History of Present Illness:   Hannah Peters is a 30 y.o. G70P1011 female at [redacted]w[redacted]d with an Estimated Date of Delivery: 08/28/20 being seen today for ongoing management of a low-risk pregnancy.  Depression screen PHQ 2/9 02/18/2020  Decreased Interest 3  Down, Depressed, Hopeless 2  PHQ - 2 Score 5  Altered sleeping 3  Tired, decreased energy 3  Change in appetite 1  Feeling bad or failure about yourself  0  Trouble concentrating 1  Moving slowly or fidgety/restless 0  Suicidal thoughts 0  PHQ-9 Score 13    Today she reports right sided back pain started yesterday. Contractions: Not present. Vag. Bleeding: None.  Movement: Present. Reports no leaking of fluid. Review of Systems:   Pertinent items are noted in HPI Denies abnormal vaginal discharge w/ itching/odor/irritation, headaches, visual changes, shortness of breath, chest pain, abdominal pain, severe nausea/vomiting, or problems with urination or bowel movements unless otherwise stated above. Pertinent History Reviewed:  Reviewed past medical,surgical, social, obstetrical and family history.  Reviewed problem list, medications and allergies. Physical Assessment:   Vitals:   06/25/20 0943  BP: 117/75  Pulse: (!) 110  Weight: 176 lb (79.8 kg)  Body mass index is 25.99 kg/m.        Physical Examination:   General appearance: Well appearing, and in no distress  Mental status: Alert, oriented to person, place, and time  Skin: Warm & dry  Cardiovascular: Normal heart rate noted  Respiratory: Normal respiratory effort, no distress  Abdomen: Soft, gravid, nontender  Pelvic: Cervical exam deferred         Extremities: Edema: Trace Right sided PSIS back spasm Trigger Point Injection   Pre-operative diagnosis:  right paraspinous/PSIS region back spasm  Post-operative diagnosis: same  After risks and benefits were explained including bleeding, infection, worsening of the pain, damage to the area being injected, weakness, allergic reaction to medications, vascular injection, and nerve damage, signed consent was obtained.  All questions were answered.    The area of the trigger point was identified and the skin prepped three times with alcohol and the alcohol allowed to dry.  Next, a 25 gauge 0.5 inch needle was placed in the area of the trigger point.  Once reproduction of the pain was elicited and negative aspiration confirmed, the trigger point was injected and the needle removed.    The patient did tolerate the procedure well and there were not complications.    Medication used:10 cc 0.5% Marcaine   Trigger points injected: multiple sites around Right PSIS    Trigger point(s) location(s):  right   Pain prior to injection: 9/10  Pain post injection: 0/10  Fetal Status: Fetal Heart Rate (bpm): 153 Fundal Height: 31 cm Movement: Present    Chaperone: n/a    Results for orders placed or performed in visit on 06/25/20 (from the past 24 hour(s))  POC Urinalysis Dipstick OB   Collection Time: 06/25/20  9:41 AM  Result Value Ref Range   Color, UA     Clarity, UA     Glucose, UA Negative Negative   Bilirubin, UA     Ketones, UA small    Spec Grav, UA     Blood, UA large    pH, UA  POC,PROTEIN,UA Moderate (2+) Negative, Trace, Small (1+), Moderate (2+), Large (3+), 4+   Urobilinogen, UA     Nitrite, UA neg    Leukocytes, UA Moderate (2+) (A) Negative   Appearance     Odor      Assessment & Plan:  1) Low-risk pregnancy G3P1011 at [redacted]w[redacted]d with an Estimated Date of Delivery: 08/28/20   2) as above,    Meds:  Meds ordered this encounter  Medications  . cyclobenzaprine (FLEXERIL) 10 MG tablet    Sig: Take 1 tablet (10 mg total) by mouth every 8 (eight) hours as needed for muscle  spasms.    Dispense:  30 tablet    Refill:  1   Labs/procedures today:   Plan:  Continue routine obstetrical care  Next visit: prefers     Reviewed:  labor symptoms and general obstetric precautions including but not limited to vaginal bleeding, contractions, leaking of fluid and fetal movement were reviewed in detail with the patient.  All questions were answered.  home bp cuff. Rx faxed to . Check bp weekly, let us know if >140/90.   Follow-up: Return for keep scheduled.  Orders Placed This Encounter  Procedures  . POC Urinalysis Dipstick OB    Lazaro Arms, MD 06/25/2020 10:25 AM

## 2020-07-02 ENCOUNTER — Encounter: Payer: BC Managed Care – PPO | Admitting: Women's Health

## 2020-07-05 ENCOUNTER — Ambulatory Visit (INDEPENDENT_AMBULATORY_CARE_PROVIDER_SITE_OTHER): Payer: BC Managed Care – PPO | Admitting: Advanced Practice Midwife

## 2020-07-05 ENCOUNTER — Other Ambulatory Visit: Payer: Self-pay

## 2020-07-05 VITALS — BP 125/71 | HR 122 | Wt 188.0 lb

## 2020-07-05 DIAGNOSIS — Z3483 Encounter for supervision of other normal pregnancy, third trimester: Secondary | ICD-10-CM

## 2020-07-05 DIAGNOSIS — Z3A32 32 weeks gestation of pregnancy: Secondary | ICD-10-CM

## 2020-07-05 MED ORDER — MEGESTROL ACETATE 40 MG PO TABS: 40.0000 mg | ORAL_TABLET | Freq: Two times a day (BID) | ORAL | 11 refills | Status: DC

## 2020-07-05 NOTE — Patient Instructions (Signed)
[name], I greatly value your feedback.  If you receive a survey following your visit with [place] today, we appreciate you taking the time to fill it out.  Thanks, Cathie Beams, DNP, CNM  [facility] HAS MOVED!!! It is now [facility] at [facility] ([address] [place], [place] [address]) Entrance located off of [address] Free 24/7 valet parking   Go to [email or web address] to register for FREE online childbirth classes    Call the office [phone]) or go to [facility] if:  You begin to have strong, frequent contractions  Your water breaks.  Sometimes it is a big gush of fluid, sometimes it is just a trickle that keeps getting your panties wet or running down your legs  You have vaginal bleeding.  It is normal to have a small amount of spotting if your cervix was checked.   You don't feel your baby moving like normal.  If you don't, get you something to eat and drink and lay down and focus on feeling your baby move.  You should feel at least 10 movements in 2 hours.  If you don't, you should call the office or go to [facility].   Home Blood Pressure Monitoring for Patients   Your provider has recommended that you check your blood pressure (BP) at least once a week at home. If you do not have a blood pressure cuff at home, one will be provided for you. Contact your provider if you have not received your monitor within 1 week.   Helpful Tips for Accurate Home Blood Pressure Checks  . Don't smoke, exercise, or drink caffeine 30 minutes before checking your BP . Use the restroom before checking your BP (a full bladder can raise your pressure) . Relax in a comfortable upright chair . Feet on the ground . Left arm resting comfortably on a flat surface at the level of your heart . Legs uncrossed . Back supported . Sit quietly and don't talk . Place the cuff on your bare arm . Adjust snuggly, so that only two fingertips can fit  between your skin and the top of the cuff . Check 2 readings separated by at least one minute . Keep a log of your BP readings . For a visual, please reference this diagram: http://ccnc.care/bpdiagram  Provider Name: [facility]     Phone: [phone]  Zone 1: ALL CLEAR  Continue to monitor your symptoms:  . BP reading is less than 140 (top number) or less than 90 (bottom number)  . No right upper stomach pain . No headaches or seeing spots . No feeling nauseated or throwing up . No swelling in face and hands  Zone 2: CAUTION Call your doctor's office for any of the following:  . BP reading is greater than 140 (top number) or greater than 90 (bottom number)  . Stomach pain under your ribs in the middle or right side . Headaches or seeing spots . Feeling nauseated or throwing up . Swelling in face and hands  Zone 3: EMERGENCY  Seek immediate medical care if you have any of the following:  . BP reading is greater than160 (top number) or greater than 110 (bottom number) . Severe headaches not improving with Tylenol . Serious difficulty catching your breath . Any worsening symptoms from Zone 2

## 2020-07-05 NOTE — Progress Notes (Signed)
   LOW-RISK PREGNANCY VISIT Patient name: Hannah Peters MRN [id number]  Date of birth: 1990-09-25 Chief Complaint:   Routine Prenatal Visit  History of Present Illness:   Hannah Peters is a 30 y.o. G71P1011 female at [redacted]w[redacted]d with an Estimated Date of Delivery: 08/28/20 being seen today for ongoing management of a low-risk pregnancy.  Today she reports no complaints.Normal pregnancy complaints at this time. Hard time at work, going to try to stick it out.  Contractions: Irritability. Vag. Bleeding: None.  Movement: Present. denies leaking of fluid. Review of Systems:   Pertinent items are noted in HPI Denies abnormal vaginal discharge w/ itching/odor/irritation, headaches, visual changes, shortness of breath, chest pain, abdominal pain, severe nausea/vomiting, or problems with urination or bowel movements unless otherwise stated above. Pertinent History Reviewed:  Reviewed past medical,surgical, social, obstetrical and family history.  Reviewed problem list, medications and allergies. Physical Assessment:   Vitals:   07/05/20 1536  BP: 125/71  Pulse: (!) 122  Weight: 188 lb (85.3 kg)  Body mass index is 27.76 kg/m.        Physical Examination:   General appearance: Well appearing, and in no distress  Mental status: Alert, oriented to person, place, and time  Skin: Warm & dry  Cardiovascular: Normal heart rate noted  Respiratory: Normal respiratory effort, no distress  Abdomen: Soft, gravid, nontender  Pelvic: Cervical exam deferred         Extremities: Edema: Trace  Fetal Status: Fetal Heart Rate (bpm): 150 Fundal Height: 32 cm Movement: Present    Chaperone: n/a    No results found for this or any previous visit (from the past 24 hour(s)).  Assessment & Plan:  1) Low-risk pregnancy G3P1011 at [redacted]w[redacted]d with an Estimated Date of Delivery: 08/28/20     Labs/procedures today: none  Plan:  Continue routine obstetrical care  Next visit: prefers online    Reviewed: Preterm labor  symptoms and general obstetric precautions including but not limited to vaginal bleeding, contractions, leaking of fluid and fetal movement were reviewed in detail with the patient.  All questions were answered. Has home bp cuff. Check bp weekly, let us know if >140/90.   Follow-up: Return in about 2 weeks (around 07/19/2020) for [facility] Mychart visit.  No orders of the defined types were placed in this encounter.  Jacklyn Shell DNP, CNM 07/05/2020 4:14 PM

## 2020-07-06 ENCOUNTER — Telehealth: Payer: Self-pay | Admitting: Advanced Practice Midwife

## 2020-07-06 NOTE — Telephone Encounter (Signed)
Patient called stating that she seen Drenda Freeze yesterday and when she got home her mychart alerted her that she had a prescription for her, but it was not hers it was for another patient. Patient just wanted to let the nurse or fran know about this, patient does not need a call back.

## 2020-07-21 ENCOUNTER — Telehealth (INDEPENDENT_AMBULATORY_CARE_PROVIDER_SITE_OTHER): Payer: BC Managed Care – PPO | Admitting: Advanced Practice Midwife

## 2020-07-21 ENCOUNTER — Encounter: Payer: Self-pay | Admitting: Advanced Practice Midwife

## 2020-07-21 VITALS — BP 113/73 | HR 131

## 2020-07-21 DIAGNOSIS — Z3A34 34 weeks gestation of pregnancy: Secondary | ICD-10-CM

## 2020-07-21 DIAGNOSIS — Z3480 Encounter for supervision of other normal pregnancy, unspecified trimester: Secondary | ICD-10-CM

## 2020-07-21 DIAGNOSIS — F449 Dissociative and conversion disorder, unspecified: Secondary | ICD-10-CM

## 2020-07-21 DIAGNOSIS — Z302 Encounter for sterilization: Secondary | ICD-10-CM

## 2020-07-21 NOTE — Progress Notes (Signed)
TELEHEALTH VIRTUAL OBSTETRICS VISIT ENCOUNTER NOTE Patient name: Hannah Peters MRN [id number]  Date of birth: May 25, 1990  I connected with patient on 07/21/20 at  3:30 PM EST by MyChart and verified that I am speaking with the correct person using two identifiers. Due to COVID-19 recommendations, pt is not currently in our office, however the provider is; pt is at home.   I discussed the limitations, risks, security and privacy concerns of performing an evaluation and management service by telephone and the availability of in person appointments. I also discussed with the patient that there may be a patient responsible charge related to this service. The patient expressed understanding and agreed to proceed.  Chief Complaint:   Routine Prenatal Visit  History of Present Illness:   Hannah Peters is a 30 y.o. G54P1011 female at [redacted]w[redacted]d with an Estimated Date of Delivery: 08/28/20 being evaluated today for ongoing management of a low-risk pregnancy.  Depression screen PHQ 2/9 02/18/2020  Decreased Interest 3  Down, Depressed, Hopeless 2  PHQ - 2 Score 5  Altered sleeping 3  Tired, decreased energy 3  Change in appetite 1  Feeling bad or failure about yourself  0  Trouble concentrating 1  Moving slowly or fidgety/restless 0  Suicidal thoughts 0  PHQ-9 Score 13    Today she reports having discomforts of late preg (loose hips, low pelvic pressure); still very much wants a BTL!Marland Kitchen Contractions: Irritability. Vag. Bleeding: None.  Movement: Present. denies leaking of fluid. Review of Systems:   Pertinent items are noted in HPI Denies abnormal vaginal discharge w/ itching/odor/irritation, headaches, visual changes, shortness of breath, chest pain, abdominal pain, severe nausea/vomiting, or problems with urination or bowel movements unless otherwise stated above. Pertinent History Reviewed:  Reviewed past medical,surgical, social, obstetrical and family history.  Reviewed problem list,  medications and allergies. Physical Assessment:   Vitals:   07/21/20 1536  BP: 113/73  Pulse: (!) 131  There is no height or weight on file to calculate BMI.        Physical Examination:   General:  Alert, oriented and cooperative.   Mental Status: Normal mood and affect perceived. Normal judgment and thought content.  Rest of physical exam deferred due to type of encounter  No results found for this or any previous visit (from the past 24 hour(s)).  Assessment & Plan:  1) Pregnancy G3P1011 at [redacted]w[redacted]d with an Estimated Date of Delivery: 08/28/20   2) Desires ppBTL, hasn't signed papers yet>will stop by tomorrow; understands it needs to be at least 30 days for it to possibly be done inpatient  3) Hx pre-e, BP stable   Meds: No orders of the defined types were placed in this encounter.   Labs/procedures today: none  Plan:  Continue routine obstetrical care .  Has home bp cuff.  Check bp weekly, let us know if >140/90.  Next visit: prefers will be in person for GBS/cultures    Reviewed: Preterm labor symptoms and general obstetric precautions including but not limited to vaginal bleeding, contractions, leaking of fluid and fetal movement were reviewed in detail with the patient. The patient was advised to call back or seek an in-person office evaluation/go to MAU at [facility] for any urgent or concerning symptoms. All questions were answered. Please refer to After Visit Summary for other counseling recommendations.    I provided 8 minutes of non-face-to-face time during this encounter.  Follow-up: Return in about 2 weeks (around 08/04/2020) for LROB,  in person for GBS/cultures (will come by tomorrow to sign BTL papers).  No orders of the defined types were placed in this encounter.  Arabella Merles CNM 07/21/2020 3:51 PM

## 2020-08-03 ENCOUNTER — Telehealth: Payer: Self-pay

## 2020-08-03 ENCOUNTER — Ambulatory Visit (INDEPENDENT_AMBULATORY_CARE_PROVIDER_SITE_OTHER): Payer: BC Managed Care – PPO | Admitting: Obstetrics & Gynecology

## 2020-08-03 ENCOUNTER — Encounter: Payer: Self-pay | Admitting: Obstetrics & Gynecology

## 2020-08-03 ENCOUNTER — Other Ambulatory Visit: Payer: Self-pay

## 2020-08-03 ENCOUNTER — Other Ambulatory Visit (HOSPITAL_COMMUNITY)
Admission: RE | Admit: 2020-08-03 | Discharge: 2020-08-03 | Disposition: A | Discharge status: Home / Self Care | Payer: BC Managed Care – PPO | Source: Ambulatory Visit | Attending: Obstetrics & Gynecology | Admitting: Obstetrics & Gynecology

## 2020-08-03 VITALS — BP 135/74 | HR 121

## 2020-08-03 DIAGNOSIS — Z3A36 36 weeks gestation of pregnancy: Secondary | ICD-10-CM | POA: Diagnosis present

## 2020-08-03 DIAGNOSIS — Z3480 Encounter for supervision of other normal pregnancy, unspecified trimester: Secondary | ICD-10-CM

## 2020-08-03 DIAGNOSIS — O09299 Supervision of pregnancy with other poor reproductive or obstetric history, unspecified trimester: Secondary | ICD-10-CM

## 2020-08-03 LAB — POCT URINALYSIS DIPSTICK OB
Blood, UA: NEGATIVE
Glucose, UA: NEGATIVE
Ketones, UA: NEGATIVE
Leukocytes, UA: NEGATIVE
Nitrite, UA: NEGATIVE

## 2020-08-03 NOTE — Progress Notes (Signed)
LOW-RISK PREGNANCY VISIT Patient name: Hannah Peters MRN [id number]  Date of birth: [birth year] Chief Complaint:   Routine Prenatal Visit (Right inguinal pain, tenderness, cultures)  History of Present Illness:   Hannah Peters is a 30 y.o. G55P1011 female at [redacted]w[redacted]d with an Estimated Date of Delivery: 08/28/20 being seen today for ongoing management of a low-risk pregnancy.  Depression screen PHQ 2/9 02/18/2020  Decreased Interest 3  Down, Depressed, Hopeless 2  PHQ - 2 Score 5  Altered sleeping 3  Tired, decreased energy 3  Change in appetite 1  Feeling bad or failure about yourself  0  Trouble concentrating 1  Moving slowly or fidgety/restless 0  Suicidal thoughts 0  PHQ-9 Score 13    Today she reports right hip pain. Contractions: Irregular. Vag. Bleeding: None.  Movement: Present. denies leaking of fluid. Review of Systems:   Pertinent items are noted in HPI Denies abnormal vaginal discharge w/ itching/odor/irritation, headaches, visual changes, shortness of breath, chest pain, abdominal pain, severe nausea/vomiting, or problems with urination or bowel movements unless otherwise stated above. Pertinent History Reviewed:  Reviewed past medical,surgical, social, obstetrical and family history.  Reviewed problem list, medications and allergies. Physical Assessment:   Vitals:   08/03/20 1530 08/03/20 1534  BP: 132/79 135/74  Pulse: (!) 121   There is no height or weight on file to calculate BMI.        Physical Examination:   General appearance: Well appearing, and in no distress  Mental status: Alert, oriented to person, place, and time  Skin: Warm & dry  Cardiovascular: Normal heart rate noted  Respiratory: Normal respiratory effort, no distress  Abdomen: Soft, gravid, nontender  Pelvic: Cervical exam performed  Dilation: Closed Effacement (%): Thick Station: -3  Extremities: Edema: None  Fetal Status: Fetal Heart Rate (bpm): 150 Fundal Height: 33 cm Movement: Present  Presentation: Vertex  Chaperone: n/a    Results for orders placed or performed in visit on 08/03/20 (from the past 24 hour(s))  POC Urinalysis Dipstick OB   Collection Time: 08/03/20  3:36 PM  Result Value Ref Range   Color, UA     Clarity, UA     Glucose, UA Negative Negative   Bilirubin, UA     Ketones, UA neg    Spec Grav, UA     Blood, UA neg    pH, UA     POC,PROTEIN,UA Small (1+) Negative, Trace, Small (1+), Moderate (2+), Large (3+), 4+   Urobilinogen, UA     Nitrite, UA neg    Leukocytes, UA Negative Negative   Appearance     Odor      Assessment & Plan:  1) Low-risk pregnancy G3P1011 at [redacted]w[redacted]d with an Estimated Date of Delivery: 08/28/20   2) Right hip pain,    Meds: No orders of the defined types were placed in this encounter.  Labs/procedures today:   Plan:  Continue routine obstetrical care  Next visit: prefers in person    Reviewed: Term labor symptoms and general obstetric precautions including but not limited to vaginal bleeding, contractions, leaking of fluid and fetal movement were reviewed in detail with the patient.  All questions were answered. Has home bp cuff. Rx faxed to . Check bp weekly, let us know if >140/90.   Follow-up: Return in about 1 week (around 08/10/2020) for LROB.  Orders Placed This Encounter  Procedures  . Culture, beta strep (group b only)  . Protein / creatinine ratio, urine  .  POC Urinalysis Dipstick OB    Lazaro Arms, MD 08/03/2020 3:53 PM

## 2020-08-04 LAB — PROTEIN / CREATININE RATIO, URINE
Creatinine, Urine: 207.2 mg/dL
Protein, Ur: 104.5 mg/dL
Protein/Creat Ratio: 504 mg/g creat — ABNORMAL HIGH (ref 0–200)

## 2020-08-05 LAB — CERVICOVAGINAL ANCILLARY ONLY
Chlamydia: NEGATIVE
Comment: NEGATIVE
Comment: NORMAL
Neisseria Gonorrhea: NEGATIVE

## 2020-08-06 ENCOUNTER — Encounter: Payer: BC Managed Care – PPO | Admitting: Advanced Practice Midwife

## 2020-08-07 LAB — CULTURE, BETA STREP (GROUP B ONLY): Strep Gp B Culture: NEGATIVE

## 2020-08-10 ENCOUNTER — Ambulatory Visit (INDEPENDENT_AMBULATORY_CARE_PROVIDER_SITE_OTHER): Payer: BC Managed Care – PPO | Admitting: Women's Health

## 2020-08-10 ENCOUNTER — Encounter: Payer: Self-pay | Admitting: Women's Health

## 2020-08-10 ENCOUNTER — Other Ambulatory Visit: Payer: Self-pay

## 2020-08-10 VITALS — BP 123/81 | HR 123 | Wt 190.8 lb

## 2020-08-10 DIAGNOSIS — Z23 Encounter for immunization: Secondary | ICD-10-CM | POA: Diagnosis not present

## 2020-08-10 DIAGNOSIS — O0993 Supervision of high risk pregnancy, unspecified, third trimester: Secondary | ICD-10-CM

## 2020-08-10 DIAGNOSIS — Z3480 Encounter for supervision of other normal pregnancy, unspecified trimester: Secondary | ICD-10-CM

## 2020-08-10 DIAGNOSIS — Z3483 Encounter for supervision of other normal pregnancy, third trimester: Secondary | ICD-10-CM

## 2020-08-10 DIAGNOSIS — Z1389 Encounter for screening for other disorder: Secondary | ICD-10-CM

## 2020-08-10 DIAGNOSIS — R030 Elevated blood-pressure reading, without diagnosis of hypertension: Secondary | ICD-10-CM

## 2020-08-10 LAB — POCT URINALYSIS DIPSTICK OB
Glucose, UA: NEGATIVE
Ketones, UA: NEGATIVE
Leukocytes, UA: NEGATIVE
Nitrite, UA: NEGATIVE

## 2020-08-10 NOTE — Patient Instructions (Signed)
Hannah Peters, I greatly value your feedback.  If you receive a survey following your visit with [place] today, we appreciate you taking the time to fill it out.  Thanks, Joellyn Haff, CNM, WHNP-BC  [facility] ([address] [place], [place] [address]) Entrance C, located off of [address] valet parking   Go to [email or web address] to register for FREE online childbirth classes    Call the office [phone]) or go to [facility] if:  You begin to have strong, frequent contractions  Your water breaks.  Sometimes it is a big gush of fluid, sometimes it is just a trickle that keeps getting your panties wet or running down your legs  You have vaginal bleeding.  It is normal to have a small amount of spotting if your cervix was checked.   You don't feel your baby moving like normal.  If you don't, get you something to eat and drink and lay down and focus on feeling your baby move.  You should feel at least 10 movements in 2 hours.  If you don't, you should call the office or go to [facility].   Call the office [phone]) or go to [facility] hospital for these signs of pre-eclampsia:  Severe headache that does not go away with Tylenol  Visual changes- seeing spots, double, blurred vision  Pain under your right breast or upper abdomen that does not go away with Tums or heartburn medicine  Nausea and/or vomiting  Severe swelling in your hands, feet, and face    Home Blood Pressure Monitoring for Patients   Your provider has recommended that you check your blood pressure (BP) at least once a week at home. If you do not have a blood pressure cuff at home, one will be provided for you. Contact your provider if you have not received your monitor within 1 week.   Helpful Tips for Accurate Home Blood Pressure Checks  . Don't smoke, exercise, or drink caffeine 30 minutes before checking your BP . Use the restroom before checking your BP (a full  bladder can raise your pressure) . Relax in a comfortable upright chair . Feet on the ground . Left arm resting comfortably on a flat surface at the level of your heart . Legs uncrossed . Back supported . Sit quietly and don't talk . Place the cuff on your bare arm . Adjust snuggly, so that only two fingertips can fit between your skin and the top of the cuff . Check 2 readings separated by at least one minute . Keep a log of your BP readings . For a visual, please reference this diagram: http://ccnc.care/bpdiagram  Provider Name: [facility]     Phone: [phone]  Zone 1: ALL CLEAR  Continue to monitor your symptoms:  . BP reading is less than 140 (top number) or less than 90 (bottom number)  . No right upper stomach pain . No headaches or seeing spots . No feeling nauseated or throwing up . No swelling in face and hands  Zone 2: CAUTION Call your doctor's office for any of the following:  . BP reading is greater than 140 (top number) or greater than 90 (bottom number)  . Stomach pain under your ribs in the middle or right side . Headaches or seeing spots . Feeling nauseated or throwing up . Swelling in face and hands  Zone 3: EMERGENCY  Seek immediate medical care if you have any of  the following:  . BP reading is greater than160 (top number) or greater than 110 (bottom number) . Severe headaches not improving with Tylenol . Serious difficulty catching your breath . Any worsening symptoms from Zone 2   Braxton Hicks Contractions Contractions of the uterus can occur throughout pregnancy, but they are not always a sign that you are in labor. You may have practice contractions called Braxton Hicks contractions. These false labor contractions are sometimes confused with true labor. What are Braxton Hicks contractions? Braxton Hicks contractions are tightening movements that occur in the muscles of the uterus before labor. Unlike true labor contractions, these  contractions do not result in opening (dilation) and thinning of the cervix. Toward the end of pregnancy (32-34 weeks), Braxton Hicks contractions can happen more often and may become stronger. These contractions are sometimes difficult to tell apart from true labor because they can be very uncomfortable. You should not feel embarrassed if you go to the hospital with false labor. Sometimes, the only way to tell if you are in true labor is for your health care provider to look for changes in the cervix. The health care provider will do a physical exam and may monitor your contractions. If you are not in true labor, the exam should show that your cervix is not dilating and your water has not broken. If there are no other health problems associated with your pregnancy, it is completely safe for you to be sent home with false labor. You may continue to have Braxton Hicks contractions until you go into true labor. How to tell the difference between true labor and false labor True labor  Contractions last 30-70 seconds.  Contractions become very regular.  Discomfort is usually felt in the top of the uterus, and it spreads to the lower abdomen and low back.  Contractions do not go away with walking.  Contractions usually become more intense and increase in frequency.  The cervix dilates and gets thinner. False labor  Contractions are usually shorter and not as strong as true labor contractions.  Contractions are usually irregular.  Contractions are often felt in the front of the lower abdomen and in the groin.  Contractions may go away when you walk around or change positions while lying down.  Contractions get weaker and are shorter-lasting as time goes on.  The cervix usually does not dilate or become thin. Follow these instructions at home:  1. Take over-the-counter and prescription medicines only as told by your health care provider. 2. Keep up with your usual exercises and follow other  instructions from your health care provider. 3. Eat and drink lightly if you think you are going into labor. 4. If Braxton Hicks contractions are making you uncomfortable: ? Change your position from lying down or resting to walking, or change from walking to resting. ? Sit and rest in a tub of warm water. ? Drink enough fluid to keep your urine pale yellow. Dehydration may cause these contractions. ? Do slow and deep breathing several times an hour. 5. Keep all follow-up prenatal visits as told by your health care provider. This is important. Contact a health care provider if:  You have a fever.  You have continuous pain in your abdomen. Get help right away if:  Your contractions become stronger, more regular, and closer together.  You have fluid leaking or gushing from your vagina.  You pass blood-tinged mucus (bloody show).  You have bleeding from your vagina.  You have low back   pain that you never had before.  You feel your baby's head pushing down and causing pelvic pressure.  Your baby is not moving inside you as much as it used to. Summary  Contractions that occur before labor are called Braxton Hicks contractions, false labor, or practice contractions.  Braxton Hicks contractions are usually shorter, weaker, farther apart, and less regular than true labor contractions. True labor contractions usually become progressively stronger and regular, and they become more frequent.  Manage discomfort from [facility] contractions by changing position, resting in a warm bath, drinking plenty of water, or practicing deep breathing. This information is not intended to replace advice given to you by your health care provider. Make sure you discuss any questions you have with your health care provider. Document Revised: 04/13/2017 Document Reviewed: 09/14/2016 Elsevier Patient Education  [place].

## 2020-08-10 NOTE — Progress Notes (Signed)
LOW-RISK PREGNANCY VISIT Patient name: Hannah Peters MRN [id number]  Date of birth: [birth year] Chief Complaint:   Routine Prenatal Visit  History of Present Illness:   Hannah Peters is a 30 y.o. G66P1011 female at [redacted]w[redacted]d with an Estimated Date of Delivery: 08/28/20 being seen today for ongoing management of a low-risk pregnancy.  Depression screen PHQ 2/9 02/18/2020  Decreased Interest 3  Down, Depressed, Hopeless 2  PHQ - 2 Score 5  Altered sleeping 3  Tired, decreased energy 3  Change in appetite 1  Feeling bad or failure about yourself  0  Trouble concentrating 1  Moving slowly or fidgety/restless 0  Suicidal thoughts 0  PHQ-9 Score 13    Today she reports elevated home bp's. 136-152/68-84. Denies ha, visual changes, ruq/epigastric pain, n/v. Does have some RUQ burning occ. Wants BTL, hasn't signed consent yet.  Contractions: Not present. Vag. Bleeding: None.  Movement: Present. denies leaking of fluid. Review of Systems:   Pertinent items are noted in HPI Denies abnormal vaginal discharge w/ itching/odor/irritation, headaches, visual changes, shortness of breath, chest pain, abdominal pain, severe nausea/vomiting, or problems with urination or bowel movements unless otherwise stated above. Pertinent History Reviewed:  Reviewed past medical,surgical, social, obstetrical and family history.  Reviewed problem list, medications and allergies. Physical Assessment:   Vitals:   08/10/20 1159 08/10/20 1203  BP: (!) 141/72 123/81  Pulse: (!) 123 (!) 123  Weight: 190 lb 12.8 oz (86.5 kg)   Body mass index is 28.18 kg/m.        Physical Examination: by Albertine Grates, SNP  General appearance: Well appearing, and in no distress  Mental status: Alert, oriented to person, place, and time  Skin: Warm & dry  Cardiovascular: Normal heart rate noted  Respiratory: Normal respiratory effort, no distress  Abdomen: Soft, gravid, nontender  Pelvic: Cervical exam performed  Dilation: 1  Effacement (%): 50 Station: -3  Extremities: Edema: None  Fetal Status: Fetal Heart Rate (bpm): 147 Fundal Height: 35 cm Movement: Present Presentation: Vertex  Chaperone: me   Results for orders placed or performed in visit on 08/10/20 (from the past 24 hour(s))  POC Urinalysis Dipstick OB   Collection Time: 08/10/20 12:11 PM  Result Value Ref Range   Color, UA     Clarity, UA     Glucose, UA Negative Negative   Bilirubin, UA     Ketones, UA neg    Spec Grav, UA     Blood, UA large    pH, UA     POC,PROTEIN,UA Small (1+) Negative, Trace, Small (1+), Moderate (2+), Large (3+), 4+   Urobilinogen, UA     Nitrite, UA neg    Leukocytes, UA Negative Negative   Appearance     Odor      Assessment & Plan:  1) Low-risk pregnancy G3P1011 at [redacted]w[redacted]d with an Estimated Date of Delivery: 08/28/20   2) Elevated bp, w/ h/o pre-e, on ASA, repeat bp normal, P:C last visit 504 (baseline 301). Asymptomatic. Some elevated home bp's. Will get labs today, bring back tomorrow for bp check w/ nurse- if elevated will go in for direct admit/IOL. Reviewed pre-e s/s, reasons to seek care tonight.   3) Wants BTL> reviewed risks/benefits, discussed high incidence regret <30yo, LARCs just as effective, consent signed today    Meds: No orders of the defined types were placed in this encounter.  Labs/procedures today: SVE and labs  Plan:  Continue routine obstetrical care  Next visit: prefers  will be in person for bp check    Reviewed: Term labor symptoms and general obstetric precautions including but not limited to vaginal bleeding, contractions, leaking of fluid and fetal movement were reviewed in detail with the patient.  All questions were answered.   Follow-up: Return for tomorrow for bp check w/ nurse, Sign BTL consent today.  No future appointments.  Orders Placed This Encounter  Procedures  . Tdap vaccine greater than or equal to 7yo IM  . CBC  . Comprehensive metabolic panel  . POC Urinalysis  Dipstick OB   Cheral Marker CNM, [facility] 08/10/2020 12:39 PM

## 2020-08-11 ENCOUNTER — Ambulatory Visit (INDEPENDENT_AMBULATORY_CARE_PROVIDER_SITE_OTHER): Payer: BC Managed Care – PPO

## 2020-08-11 ENCOUNTER — Encounter (HOSPITAL_COMMUNITY): Payer: Self-pay | Admitting: Family Medicine

## 2020-08-11 ENCOUNTER — Inpatient Hospital Stay (HOSPITAL_COMMUNITY): Payer: BC Managed Care – PPO | Admitting: Anesthesiology

## 2020-08-11 ENCOUNTER — Inpatient Hospital Stay (HOSPITAL_COMMUNITY)
Admission: AD | Admit: 2020-08-11 | Discharge: 2020-08-13 | DRG: 807 | Disposition: A | Discharge status: Home / Self Care | Payer: BC Managed Care – PPO | Attending: Obstetrics and Gynecology | Admitting: Obstetrics and Gynecology

## 2020-08-11 VITALS — BP 138/82 | HR 132 | Wt 192.8 lb

## 2020-08-11 DIAGNOSIS — O99344 Other mental disorders complicating childbirth: Secondary | ICD-10-CM | POA: Diagnosis present

## 2020-08-11 DIAGNOSIS — O1404 Mild to moderate pre-eclampsia, complicating childbirth: Principal | ICD-10-CM | POA: Diagnosis present

## 2020-08-11 DIAGNOSIS — R319 Hematuria, unspecified: Secondary | ICD-10-CM

## 2020-08-11 DIAGNOSIS — O99334 Smoking (tobacco) complicating childbirth: Secondary | ICD-10-CM | POA: Diagnosis present

## 2020-08-11 DIAGNOSIS — F4310 Post-traumatic stress disorder, unspecified: Secondary | ICD-10-CM | POA: Diagnosis present

## 2020-08-11 DIAGNOSIS — O26892 Other specified pregnancy related conditions, second trimester: Secondary | ICD-10-CM

## 2020-08-11 DIAGNOSIS — Z3A37 37 weeks gestation of pregnancy: Secondary | ICD-10-CM

## 2020-08-11 DIAGNOSIS — R519 Headache, unspecified: Secondary | ICD-10-CM

## 2020-08-11 DIAGNOSIS — O0993 Supervision of high risk pregnancy, unspecified, third trimester: Secondary | ICD-10-CM | POA: Diagnosis not present

## 2020-08-11 DIAGNOSIS — F1290 Cannabis use, unspecified, uncomplicated: Secondary | ICD-10-CM

## 2020-08-11 DIAGNOSIS — Z3480 Encounter for supervision of other normal pregnancy, unspecified trimester: Secondary | ICD-10-CM

## 2020-08-11 DIAGNOSIS — O1403 Mild to moderate pre-eclampsia, third trimester: Secondary | ICD-10-CM

## 2020-08-11 DIAGNOSIS — F445 Conversion disorder with seizures or convulsions: Secondary | ICD-10-CM | POA: Diagnosis present

## 2020-08-11 DIAGNOSIS — Z302 Encounter for sterilization: Secondary | ICD-10-CM

## 2020-08-11 DIAGNOSIS — O1400 Mild to moderate pre-eclampsia, unspecified trimester: Secondary | ICD-10-CM | POA: Diagnosis present

## 2020-08-11 DIAGNOSIS — F17210 Nicotine dependence, cigarettes, uncomplicated: Secondary | ICD-10-CM | POA: Diagnosis present

## 2020-08-11 DIAGNOSIS — F17200 Nicotine dependence, unspecified, uncomplicated: Secondary | ICD-10-CM | POA: Diagnosis present

## 2020-08-11 DIAGNOSIS — F1911 Other psychoactive substance abuse, in remission: Secondary | ICD-10-CM

## 2020-08-11 DIAGNOSIS — F449 Dissociative and conversion disorder, unspecified: Secondary | ICD-10-CM | POA: Diagnosis present

## 2020-08-11 LAB — COMPREHENSIVE METABOLIC PANEL
ALT: 12 IU/L (ref 0–32)
ALT: 14 U/L (ref 0–44)
AST: 19 IU/L (ref 0–40)
AST: 18 U/L (ref 15–41)
Albumin/Globulin Ratio: 1 — ABNORMAL LOW (ref 1.2–2.2)
Albumin: 3.4 g/dL — ABNORMAL LOW (ref 3.9–5.0)
Albumin: 2.5 g/dL — ABNORMAL LOW (ref 3.5–5.0)
Alkaline Phosphatase: 195 IU/L — ABNORMAL HIGH (ref 44–121)
Alkaline Phosphatase: 142 U/L — ABNORMAL HIGH (ref 38–126)
Anion gap: 7 (ref 5–15)
BUN/Creatinine Ratio: 13 (ref 9–23)
BUN: 7 mg/dL (ref 6–20)
BUN: 6 mg/dL (ref 6–20)
Bilirubin Total: <0.2 mg/dL (ref 0.0–1.2)
CO2: 21 mmol/L (ref 20–29)
CO2: 24 mmol/L (ref 22–32)
Calcium: 8.9 mg/dL (ref 8.7–10.2)
Calcium: 8.3 mg/dL — ABNORMAL LOW (ref 8.9–10.3)
Chloride: 98 mmol/L (ref 96–106)
Chloride: 104 mmol/L (ref 98–111)
Creatinine, Ser: 0.56 mg/dL — ABNORMAL LOW (ref 0.57–1.00)
Creatinine, Ser: 0.53 mg/dL (ref 0.44–1.00)
GFR, Estimated: >60 mL/min (ref >60)
Globulin, Total: 3.3 g/dL (ref 1.5–4.5)
Glucose, Bld: 87 mg/dL (ref 70–99)
Glucose: 107 mg/dL — ABNORMAL HIGH (ref 65–99)
Potassium: 4.2 mmol/L (ref 3.5–5.2)
Potassium: 3.6 mmol/L (ref 3.5–5.1)
Sodium: 134 mmol/L (ref 134–144)
Sodium: 135 mmol/L (ref 135–145)
Total Bilirubin: 0.3 mg/dL (ref 0.3–1.2)
Total Protein: 6.7 g/dL (ref 6.0–8.5)
Total Protein: 6 g/dL — ABNORMAL LOW (ref 6.5–8.1)
eGFR: 126 mL/min/{1.73_m2} (ref >59)

## 2020-08-11 LAB — CBC
HCT: 31.5 % — ABNORMAL LOW (ref 36.0–46.0)
HCT: 31 % — ABNORMAL LOW (ref 36.0–46.0)
Hematocrit: 33.7 % — ABNORMAL LOW (ref 34.0–46.6)
Hemoglobin: 10.9 g/dL — ABNORMAL LOW (ref 11.1–15.9)
Hemoglobin: 10 g/dL — ABNORMAL LOW (ref 12.0–15.0)
Hemoglobin: 9.9 g/dL — ABNORMAL LOW (ref 12.0–15.0)
MCH: 25.8 pg — ABNORMAL LOW (ref 26.6–33.0)
MCH: 25.7 pg — ABNORMAL LOW (ref 26.0–34.0)
MCH: 25.8 pg — ABNORMAL LOW (ref 26.0–34.0)
MCHC: 32.3 g/dL (ref 31.5–35.7)
MCHC: 31.7 g/dL (ref 30.0–36.0)
MCHC: 31.9 g/dL (ref 30.0–36.0)
MCV: 80 fL (ref 79–97)
MCV: 81 fL (ref 80.0–100.0)
MCV: 80.9 fL (ref 80.0–100.0)
Platelets: 288 10*3/uL (ref 150–450)
Platelets: 269 10*3/uL (ref 150–400)
Platelets: 251 10*3/uL (ref 150–400)
RBC: 4.22 x10E6/uL (ref 3.77–5.28)
RBC: 3.89 MIL/uL (ref 3.87–5.11)
RBC: 3.83 MIL/uL — ABNORMAL LOW (ref 3.87–5.11)
RDW: 14.4 % (ref 11.7–15.4)
RDW: 14.6 % (ref 11.5–15.5)
RDW: 14.6 % (ref 11.5–15.5)
WBC: 14.5 10*3/uL — ABNORMAL HIGH (ref 3.4–10.8)
WBC: 12.3 10*3/uL — ABNORMAL HIGH (ref 4.0–10.5)
WBC: 17.2 10*3/uL — ABNORMAL HIGH (ref 4.0–10.5)
nRBC: 0 % (ref 0.0–0.2)
nRBC: 0 % (ref 0.0–0.2)

## 2020-08-11 LAB — PROTEIN / CREATININE RATIO, URINE
Creatinine, Urine: 118.86 mg/dL
Protein Creatinine Ratio: 0.83 mg/mg{Cre} — ABNORMAL HIGH (ref 0.00–0.15)
Total Protein, Urine: 99 mg/dL

## 2020-08-11 LAB — POCT URINALYSIS DIPSTICK OB
Glucose, UA: NEGATIVE
Ketones, UA: NEGATIVE
Nitrite, UA: NEGATIVE

## 2020-08-11 LAB — TYPE AND SCREEN
ABO/RH(D): A POS
Antibody Screen: NEGATIVE

## 2020-08-11 MED ORDER — EPHEDRINE 5 MG/ML INJ: 10.0000 mg | INTRAVENOUS | Status: DC | PRN

## 2020-08-11 MED ORDER — ONDANSETRON HCL 4 MG/2ML IJ SOLN
4.0000 mg | Freq: Four times a day (QID) | INTRAMUSCULAR | Status: DC | PRN
  Administered 2020-08-12: 4 mg via INTRAVENOUS
  Filled 2020-08-11: qty 2

## 2020-08-11 MED ORDER — OXYCODONE-ACETAMINOPHEN 5-325 MG PO TABS: 2.0000 | ORAL_TABLET | ORAL | Status: DC | PRN

## 2020-08-11 MED ORDER — ACETAMINOPHEN 325 MG PO TABS: 650.0000 mg | ORAL_TABLET | ORAL | Status: DC | PRN

## 2020-08-11 MED ORDER — OXYTOCIN-SODIUM CHLORIDE 30-0.9 UT/500ML-% IV SOLN: 2.5000 [IU]/h | INTRAVENOUS | Status: DC

## 2020-08-11 MED ORDER — TERBUTALINE SULFATE 1 MG/ML IJ SOLN: 0.2500 mg | Freq: Once | INTRAMUSCULAR | Status: DC | PRN

## 2020-08-11 MED ORDER — OXYTOCIN-SODIUM CHLORIDE 30-0.9 UT/500ML-% IV SOLN
1.0000 m[IU]/min | INTRAVENOUS | Status: DC
  Administered 2020-08-11: 1 m[IU]/min via INTRAVENOUS
  Filled 2020-08-11: qty 500

## 2020-08-11 MED ORDER — LACTATED RINGERS IV SOLN: 500.0000 mL | Freq: Once | INTRAVENOUS | Status: DC

## 2020-08-11 MED ORDER — LACTATED RINGERS IV SOLN: 500.0000 mL | INTRAVENOUS | Status: DC | PRN

## 2020-08-11 MED ORDER — MISOPROSTOL 50MCG HALF TABLET
50.0000 ug | ORAL_TABLET | ORAL | Status: DC | PRN
  Administered 2020-08-11: 50 ug via BUCCAL
  Filled 2020-08-11: qty 1

## 2020-08-11 MED ORDER — ESCITALOPRAM OXALATE 20 MG PO TABS
20.0000 mg | ORAL_TABLET | Freq: Every day | ORAL | Status: DC
  Administered 2020-08-11: 20 mg via ORAL
  Filled 2020-08-11: qty 1

## 2020-08-11 MED ORDER — SOD CITRATE-CITRIC ACID 500-334 MG/5ML PO SOLN: 30.0000 mL | ORAL | Status: DC | PRN

## 2020-08-11 MED ORDER — PHENYLEPHRINE 40 MCG/ML (10ML) SYRINGE FOR IV PUSH (FOR BLOOD PRESSURE SUPPORT): 80.0000 ug | PREFILLED_SYRINGE | INTRAVENOUS | Status: DC | PRN

## 2020-08-11 MED ORDER — OXYTOCIN BOLUS FROM INFUSION
333.0000 mL | Freq: Once | INTRAVENOUS | Status: AC
  Administered 2020-08-12: 333 mL via INTRAVENOUS

## 2020-08-11 MED ORDER — DIPHENHYDRAMINE HCL 50 MG/ML IJ SOLN: 12.5000 mg | INTRAMUSCULAR | Status: DC | PRN

## 2020-08-11 MED ORDER — LIDOCAINE HCL (PF) 1 % IJ SOLN: 30.0000 mL | INTRAMUSCULAR | Status: DC | PRN

## 2020-08-11 MED ORDER — FENTANYL-BUPIVACAINE-NACL 0.5-0.125-0.9 MG/250ML-% EP SOLN
12.0000 mL/h | EPIDURAL | Status: DC | PRN
  Administered 2020-08-11: 12 mL/h via EPIDURAL
  Filled 2020-08-11: qty 250

## 2020-08-11 MED ORDER — LACTATED RINGERS IV SOLN
INTRAVENOUS | Status: DC
Start: 2020-08-11 — End: 2020-08-12

## 2020-08-11 MED ORDER — OXYCODONE-ACETAMINOPHEN 5-325 MG PO TABS: 1.0000 | ORAL_TABLET | ORAL | Status: DC | PRN

## 2020-08-11 NOTE — Progress Notes (Addendum)
   NURSE VISIT- BLOOD PRESSURE CHECK  SUBJECTIVE:  Hannah Peters is a 30 y.o. G58P1011 female here for BP check. She is [redacted]w[redacted]d pregnant    HYPERTENSION ROS:  Pregnant/postpartum:  . Severe headaches that don't go away with tylenol/other medicines: No  . Visual changes (seeing spots/double/blurred vision) No  . Severe pain under right breast breast or in center of upper chest No  . Severe nausea/vomiting No  . Taking medicines as instructed not applicable  OBJECTIVE:  1st BP: 143/83, HR 135 BP 138/82   Pulse (!) 132   Wt 192 lb 12.8 oz (87.5 kg)   LMP 11/22/2019 (Exact Date)   BMI 28.47 kg/m   Appearance alert, well appearing, and in no distress, oriented to person, place, and time and normal appearing weight.  ASSESSMENT: Pregnancy [redacted]w[redacted]d  blood pressure check  PLAN: Discussed with Hannah Peters, CNM, [facility]   Recommendations: Induction   Follow-up: Postpartum   Hannah Peters A Elise Gladden  08/11/2020 11:09 AM   Chart reviewed for nurse visit. Agree with plan of care. Pt now meets criteria for pre-e (HTN yesterday and today and P:C 0.5), asymptomatic. To L&D for direct admit/IOL. Notified labor team and L&D charge. Cheral Marker, [place] 08/11/2020 11:28 AM

## 2020-08-11 NOTE — Progress Notes (Signed)
Labor Progress Note Hannah Peters is a 30 y.o. G3P1011 at [redacted]w[redacted]d presented with IUP at [redacted]w[redacted]d by 8 wk u/s presenting for IOL for pre-E w/o severe features. S: Patient well laying supine in bed  O:  BP (!) 143/78   Pulse 81   Temp 98.4 F (36.9 C) (Oral)   Resp 18   Ht 5\' 9"  (1.753 m)   Wt 87.1 kg   LMP 11/22/2019 (Exact Date)   BMI 28.35 kg/m  EFM: 140/+acels/-decels Contractions q 2-4  CVE: Dilation: 4 Effacement (%): 60 Station: -2 Presentation: Vertex Exam by:: Dr. [email or web address]   A&P: 30 y.o. 26 [redacted]w[redacted]d here for IOL for pre-E w/o severe features.   #Pre-E w/o severe features: P/C ratio 0.83 on admission. Cr 0.53, LFTs within normal limits. Continue to monitor.   #Labor: Progressing well. S/p cytotec x1. Foley out 1900. Starting pit at 2100.  #Pain: Epidural requested and ordered - NO NARCOTICS #FWB: Cat I #GBS negative  2101, MD 9:10 PM

## 2020-08-11 NOTE — Anesthesia Preprocedure Evaluation (Signed)
Anesthesia Evaluation  Patient identified by MRN, date of birth, ID band Patient awake    Reviewed: Allergy & Precautions, NPO status , Patient's Chart, lab work & pertinent test results  Airway Mallampati: II  TM Distance: >3 FB Neck ROM: Full    Dental no notable dental hx.    Pulmonary Current Smoker,    Pulmonary exam normal breath sounds clear to auscultation       Cardiovascular hypertension, Normal cardiovascular exam Rhythm:Regular Rate:Normal     Neuro/Psych  Headaches, Seizures - (pseudoseizures),  PSYCHIATRIC DISORDERS Anxiety    GI/Hepatic   Endo/Other    Renal/GU      Musculoskeletal   Abdominal   Peds  Hematology   Anesthesia Other Findings   Reproductive/Obstetrics (+) Pregnancy                             Anesthesia Physical Anesthesia Plan  ASA: III  Anesthesia Plan: Epidural   Post-op Pain Management:    Induction:   PONV Risk Score and Plan: 1 and Treatment may vary due to age or medical condition  Airway Management Planned: Natural Airway  Additional Equipment:   Intra-op Plan:   Post-operative Plan:   Informed Consent: I have reviewed the patients History and Physical, chart, labs and discussed the procedure including the risks, benefits and alternatives for the proposed anesthesia with the patient or authorized representative who has indicated his/her understanding and acceptance.       Plan Discussed with: Anesthesiologist  Anesthesia Plan Comments:         Anesthesia Quick Evaluation

## 2020-08-11 NOTE — Anesthesia Procedure Notes (Signed)
Epidural Patient location during procedure: OB Start time: 08/11/2020 9:58 PM End time: 08/11/2020 10:06 PM  Staffing Anesthesiologist: Mellody Dance, MD Performed: anesthesiologist   Preanesthetic Checklist Completed: patient identified, IV checked, site marked, risks and benefits discussed, monitors and equipment checked, pre-op evaluation and timeout performed  Epidural Patient position: sitting Prep: DuraPrep Patient monitoring: heart rate, cardiac monitor, continuous pulse ox and blood pressure Approach: midline Location: L2-L3 Injection technique: LOR saline  Needle:  Needle type: Tuohy  Needle gauge: 17 G Needle length: 9 cm Needle insertion depth: 5 cm Catheter type: closed end flexible Catheter size: 20 Guage Catheter at skin depth: 10 cm Test dose: negative and Other  Assessment Events: blood not aspirated, injection not painful, no injection resistance and negative IV test  Additional Notes Informed consent obtained prior to proceeding including risk of failure, 1% risk of PDPH, risk of minor discomfort and bruising.  Discussed rare but serious complications including epidural abscess, permanent nerve injury, epidural hematoma.  Discussed alternatives to epidural analgesia and patient desires to proceed.  Timeout performed pre-procedure verifying patient name, procedure, and platelet count.  Patient tolerated procedure well.
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
## 2020-08-12 ENCOUNTER — Encounter (HOSPITAL_COMMUNITY): Payer: Self-pay | Admitting: Family Medicine

## 2020-08-12 DIAGNOSIS — Z3A37 37 weeks gestation of pregnancy: Secondary | ICD-10-CM

## 2020-08-12 LAB — CBC
HCT: 32.5 % — ABNORMAL LOW (ref 36.0–46.0)
Hemoglobin: 10.3 g/dL — ABNORMAL LOW (ref 12.0–15.0)
MCH: 25.8 pg — ABNORMAL LOW (ref 26.0–34.0)
MCHC: 31.7 g/dL (ref 30.0–36.0)
MCV: 81.3 fL (ref 80.0–100.0)
Platelets: 244 10*3/uL (ref 150–400)
RBC: 4 MIL/uL (ref 3.87–5.11)
RDW: 14.5 % (ref 11.5–15.5)
WBC: 33.4 10*3/uL — ABNORMAL HIGH (ref 4.0–10.5)
nRBC: 0 % (ref 0.0–0.2)

## 2020-08-12 LAB — RPR: RPR Ser Ql: NONREACTIVE

## 2020-08-12 MED ORDER — SIMETHICONE 80 MG PO CHEW: 80.0000 mg | CHEWABLE_TABLET | ORAL | Status: DC | PRN

## 2020-08-12 MED ORDER — ACETAMINOPHEN 325 MG PO TABS: 650.0000 mg | ORAL_TABLET | ORAL | Status: DC | PRN

## 2020-08-12 MED ORDER — BUPIVACAINE HCL (PF) 0.25 % IJ SOLN
INTRAMUSCULAR | Status: DC | PRN
  Administered 2020-08-12: 8 mL via EPIDURAL

## 2020-08-12 MED ORDER — ONDANSETRON HCL 4 MG/2ML IJ SOLN: 4.0000 mg | INTRAMUSCULAR | Status: DC | PRN

## 2020-08-12 MED ORDER — PRENATAL MULTIVITAMIN CH
1.0000 | ORAL_TABLET | Freq: Every day | ORAL | Status: DC
  Administered 2020-08-12 – 2020-08-13 (×2): 1 via ORAL
  Filled 2020-08-12 (×2): qty 1

## 2020-08-12 MED ORDER — SENNOSIDES-DOCUSATE SODIUM 8.6-50 MG PO TABS
2.0000 | ORAL_TABLET | Freq: Every day | ORAL | Status: DC
  Administered 2020-08-13: 2 via ORAL
  Filled 2020-08-12: qty 2

## 2020-08-12 MED ORDER — COCONUT OIL OIL: 1.0000 "application " | TOPICAL_OIL | Status: DC | PRN

## 2020-08-12 MED ORDER — WITCH HAZEL-GLYCERIN EX PADS: 1.0000 "application " | MEDICATED_PAD | CUTANEOUS | Status: DC | PRN

## 2020-08-12 MED ORDER — IBUPROFEN 600 MG PO TABS
600.0000 mg | ORAL_TABLET | Freq: Four times a day (QID) | ORAL | Status: DC
  Administered 2020-08-12 – 2020-08-13 (×5): 600 mg via ORAL
  Filled 2020-08-12 (×5): qty 1

## 2020-08-12 MED ORDER — DIPHENHYDRAMINE HCL 25 MG PO CAPS: 25.0000 mg | ORAL_CAPSULE | Freq: Four times a day (QID) | ORAL | Status: DC | PRN

## 2020-08-12 MED ORDER — ONDANSETRON HCL 4 MG PO TABS: 4.0000 mg | ORAL_TABLET | ORAL | Status: DC | PRN

## 2020-08-12 MED ORDER — TETANUS-DIPHTH-ACELL PERTUSSIS 5-2.5-18.5 LF-MCG/0.5 IM SUSY: 0.5000 mL | PREFILLED_SYRINGE | Freq: Once | INTRAMUSCULAR | Status: DC

## 2020-08-12 MED ORDER — DIBUCAINE (PERIANAL) 1 % EX OINT: 1.0000 "application " | TOPICAL_OINTMENT | CUTANEOUS | Status: DC | PRN

## 2020-08-12 MED ORDER — ESCITALOPRAM OXALATE 20 MG PO TABS
20.0000 mg | ORAL_TABLET | Freq: Every day | ORAL | Status: DC
  Administered 2020-08-12: 20 mg via ORAL
  Filled 2020-08-12: qty 1

## 2020-08-12 MED ORDER — BENZOCAINE-MENTHOL 20-0.5 % EX AERO
1.0000 "application " | INHALATION_SPRAY | CUTANEOUS | Status: DC | PRN
  Administered 2020-08-12: 1 via TOPICAL
  Filled 2020-08-12: qty 56

## 2020-08-12 NOTE — Plan of Care (Signed)
Completed , Carlyn Reichert RN

## 2020-08-12 NOTE — Anesthesia Postprocedure Evaluation (Signed)
Anesthesia Post Note  Patient: Hannah Peters  Procedure(s) Performed: AN AD HOC LABOR EPIDURAL     Patient location during evaluation: Mother Baby Anesthesia Type: Epidural Level of consciousness: awake, awake and alert and oriented Pain management: pain level controlled Vital Signs Assessment: post-procedure vital signs reviewed and stable Respiratory status: spontaneous breathing and respiratory function stable Cardiovascular status: blood pressure returned to baseline Postop Assessment: no headache, epidural receding, patient able to bend at knees, adequate PO intake, no backache, no apparent nausea or vomiting and able to ambulate Anesthetic complications: no   No complications documented.  Last Vitals:  Vitals:   08/12/20 1050 08/12/20 1230  BP: 127/75 124/75  Pulse: 78 72  Resp: 18 20  Temp: 36.8 C 36.4 C  SpO2: 100% 100%    Last Pain:  Vitals:   08/12/20 1100  TempSrc:   PainSc: 0-No pain   Pain Goal: Patients Stated Pain Goal: 1 (08/12/20 0801)                 Cleda Clarks

## 2020-08-12 NOTE — Progress Notes (Signed)
Spoke to [employer]" answering for on call MD about Lexapro 20mg  that is currently NOT ordered in epic for pt.  MAR shows med given 08/11/2020 at 1859.    MD to review and place order or return phone call to RN.

## 2020-08-12 NOTE — Discharge Summary (Addendum)
Postpartum Discharge Summary  Date of Service updated 08/13/20    Patient Name: Hannah Peters DOB: 1990/12/02 MRN: [id number]  Date of admission: 08/11/2020 Delivery date:08/12/2020  Delivering provider: Starr Lake  Date of discharge: 08/13/2020  Admitting diagnosis: Gestational hypertension [O13.9] Intrauterine pregnancy: [redacted]w[redacted]d    Secondary diagnosis:  Active Problems:   Mild pre-eclampsia   Disassociation disorder with pseudo seizures   PTSD (post-traumatic stress disorder)   Smoker   Substance abuse in remission ([facility]   Request for sterilization  Additional problems: None Discharge diagnosis: Term Pregnancy Delivered                                              Post partum procedures:None Augmentation: AROM and Pitocin Complications: None  Hospital course: Onset of Labor With Vaginal Delivery      30y.o. yo G3P1011 at 312w5das admitted in Active Labor on 08/11/2020. Patient had an uncomplicated labor course as follows: Patient arrived for IOL due to pre-e w/o severe features (PC ratio was 0.83 and she had mild range BPs). She received cytotec, FB and pitocin and progressed to complete. After AROM and a short second stage, patient delivered a viable female infant. 2nd degree laceration and bilateral periurethral lacerations repaired.  Membrane Rupture Time/Date: 8:33 AM ,08/12/2020   Delivery Method:Vaginal, Spontaneous  Episiotomy: None  Lacerations:  Periurethral;2nd degree  Patient had an uncomplicated postpartum course. She remained normotensive. She is ambulating, tolerating a regular diet, passing flatus, and urinating well. Patient is discharged home in stable condition on 08/13/20.  Newborn Data: Birth date:08/12/2020  Birth time:8:40 AM  Gender:Female  Living status:Living  Apgars:8 ,9  Weight:2946 g   Magnesium Sulfate received: No BMZ received: No Rhophylac:No MMR:No T-DaP:Given prenatally Flu: No Transfusion:No  Physical exam  Vitals:    08/12/20 1230 08/12/20 1732 08/12/20 2100 08/13/20 0537  BP: 124/75 137/62 121/65 121/69  Pulse: 72 70 70 86  Resp: _0 Temp: 97.6 F (36.4 C) 98.2 F (36.8 C) 98.2 F (36.8 C) 98.1 F (36.7 C)  TempSrc:  Oral Oral Oral  SpO2: 100% 100%    Weight:      Height:       General: alert and cooperative Lochia: appropriate Uterine Fundus: firm Incision: N/A DVT Evaluation: No evidence of DVT seen on physical exam. Labs: Lab Results  Component Value Date   WBC 33.4 (H) 08/12/2020   HGB 10.3 (L) 08/12/2020   HCT 32.5 (L) 08/12/2020   MCV 81.3 08/12/2020   PLT 244 08/12/2020   CMP Latest Ref Rng & Units 08/11/2020  Glucose 70 - 99 mg/dL 87  BUN 6 - 20 mg/dL 6  Creatinine 0.44 - 1.00 mg/dL 0.53  Sodium 135 - 145 mmol/L 135  Potassium 3.5 - 5.1 mmol/L 3.6  Chloride 98 - 111 mmol/L 104  CO2 22 - 32 mmol/L 24  Calcium 8.9 - 10.3 mg/dL 8.3(L)  Total Protein 6.5 - 8.1 g/dL 6.0(L)  Total Bilirubin 0.3 - 1.2 mg/dL 0.3  Alkaline Phos 38 - 126 U/L 142(H)  AST 15 - 41 U/L 18  ALT 0 - 44 U/L 14   Edinburgh Score: Edinburgh Postnatal Depression Scale Screening Tool 08/12/2020  I have been able to laugh and see the funny side of things. 0  I have looked forward with enjoyment to things.  0  I have blamed myself unnecessarily when things went wrong. 2  I have been anxious or worried for no good reason. 2  I have felt scared or panicky for no good reason. 2  Things have been getting on top of me. 1  I have been so unhappy that I have had difficulty sleeping. 1  I have felt sad or miserable. 1  I have been so unhappy that I have been crying. 0  The thought of harming myself has occurred to me. 0  Edinburgh Postnatal Depression Scale Total 9     After visit meds:  Allergies as of 08/13/2020      Reactions   Ambien [zolpidem Tartrate]    seizures   Latex    rash      Medication List    STOP taking these medications   aspirin 81 MG chewable tablet    butalbital-acetaminophen-caffeine 50-325-40 MG tablet Commonly known as: FIORICET   cyclobenzaprine 10 MG tablet Commonly known as: FLEXERIL   multivitamin Chew chewable tablet     TAKE these medications   amLODipine 5 MG tablet Commonly known as: NORVASC Take 1 tablet (5 mg total) by mouth daily.   escitalopram 20 MG tablet Commonly known as: LEXAPRO Take 20 mg by mouth daily.   ibuprofen 600 MG tablet Commonly known as: ADVIL Take 1 tablet (600 mg total) by mouth every 6 (six) hours.   ondansetron 4 MG tablet Commonly known as: ZOFRAN Take by mouth.        Discharge home in stable condition Infant Feeding: Bottle Infant Disposition:home with mother Discharge instruction: per After Visit Summary and Postpartum booklet. Activity: Advance as tolerated. Pelvic rest for 6 weeks.  Diet: routine diet Future Appointments: Future Appointments  Date Time Provider [place]  08/18/2020 10:10 AM [facility] NURSE [facility]  08/30/2020  4:00 PM Florian Buff, MD [facility]  09/16/2020 11:50 AM Cresenzo-Dishmon, Joaquim Lai, CNM [facility]   Follow up Visit:   Please schedule this patient for a In person postpartum visit in 6 weeks with the following provider: Any provider. However she also needs a separate visit, can be virtual, with an MD to talk about getting her tubal ligation. This should be two separate visits.  Additional Postpartum F/U:BP check 1 week  High risk pregnancy complicated by: GHTN then pre-e w/o severe features.  Delivery mode:  Vaginal, Spontaneous  Anticipated Birth Control:  Plans Interval BTL   08/13/2020 Brooks Sailors, MD  I saw and evaluated the patient. I agree with the findings and the plan of care as documented in the resident's note.  Sharene Skeans, MD [facility] Family Medicine Fellow, [facility] for [facility], [place]

## 2020-08-12 NOTE — Progress Notes (Signed)
Patient ID: Hannah Peters, female   DOB: 1990-10-04, 30 y.o.   MRN: [id number]  Comfortable w epidural; denies s/s pre-e; s/p cytotec and FB> Pit started @ 59mu/min after the foley came out  BPs 107/70, 115/55 FHR 130s, +accels, no decels, Cat 1 Ctx irreg with Pit just increased to 89mu/min Cx after foley: 4/60/-2  IUP@37 .5wks Pre-e, mild IOL process  Will continue to increase Pit 2x2 until ctx are more reg/frequent Plan to check cx after 1-2hrs of reg ctx, or w pressure Anticipate vag del  Arabella Merles [facility] 08/12/2020 12:37 AM

## 2020-08-12 NOTE — Progress Notes (Signed)
Patient ID: Hannah Peters, female   DOB: 1990/06/27, 30 y.o.   MRN: [id number]  Comfortable w epidural; denies s/s pre-e; s/p cytotec and FB> Pit @ 10u/min  FHR 140, +accels, no decels, Cat 1 Ctx irreg with Pit just increased to 10u/min Cx after foley out with bloody show: 7/80/-1  IUP@37 .5wks Pre-e, mild IOL process  Patient in active labor, progressing well on pit rate of 10u/min Next cervical check in 2 hours Anticipate vag del  Tilden Dome MD 08/12/2020 6:46 AM

## 2020-08-13 ENCOUNTER — Other Ambulatory Visit: Payer: Self-pay | Admitting: Obstetrics and Gynecology

## 2020-08-13 MED ORDER — AMLODIPINE BESYLATE 5 MG PO TABS: 5.0000 mg | ORAL_TABLET | Freq: Every day | ORAL | 1 refills | Status: DC

## 2020-08-13 MED ORDER — AMLODIPINE BESYLATE 5 MG PO TABS
5.0000 mg | ORAL_TABLET | Freq: Every day | ORAL | Status: DC
  Administered 2020-08-13: 5 mg via ORAL
  Filled 2020-08-13: qty 1

## 2020-08-13 MED ORDER — IBUPROFEN 600 MG PO TABS: 600.0000 mg | ORAL_TABLET | Freq: Four times a day (QID) | ORAL | 0 refills | Status: DC

## 2020-08-13 MED FILL — AMLODIPINE BESYLATE 5 MG TA: 5 | 45 days supply | Qty: 45 | Fill #0

## 2020-08-13 MED FILL — IBUPROFEN 600 MG TABLET: 600 | 7 days supply | Qty: 30 | Fill #0
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
## 2020-08-18 ENCOUNTER — Ambulatory Visit (INDEPENDENT_AMBULATORY_CARE_PROVIDER_SITE_OTHER): Payer: BC Managed Care – PPO

## 2020-08-18 ENCOUNTER — Other Ambulatory Visit: Payer: Self-pay

## 2020-08-18 VITALS — BP 139/80 | HR 129 | Ht 69.0 in | Wt 179.2 lb

## 2020-08-18 DIAGNOSIS — O1403 Mild to moderate pre-eclampsia, third trimester: Secondary | ICD-10-CM

## 2020-08-18 NOTE — Progress Notes (Addendum)
   NURSE VISIT- BLOOD PRESSURE CHECK  SUBJECTIVE:  Hannah Peters is a 30 y.o. [phone] female here for BP check. She is postpartum, delivery date 08/12/20    HYPERTENSION ROS:  Pregnant/postpartum:  . Severe headaches that don't go away with tylenol/other medicines: No  . Visual changes (seeing spots/double/blurred vision) No  . Severe pain under right breast breast or in center of upper chest No  . Severe nausea/vomiting No  . Taking medicines as instructed yes  OBJECTIVE:  BP 139/80 (BP Location: Right Arm, Patient Position: Sitting, Cuff Size: Normal)   Pulse (!) 129   Ht 5\' 9"  (1.753 m)   Wt 179 lb 3.2 oz (81.3 kg)   LMP 11/22/2019 (Exact Date)   Breastfeeding No   BMI 26.46 kg/m   Appearance alert, well appearing, and in no distress, oriented to person, place, and time and normal appearing weight.  ASSESSMENT: Postpartum  blood pressure check  PLAN: Discussed with 01/23/2020, CNM   Recommendations: stop medicine 2 days before next visit   Follow-up: as scheduled   Rosland Riding A Zebulin Siegel  08/18/2020 10:31 AM   Chart reviewed for nurse visit. Agree with plan of care.  10/18/2020, CNM 08/18/2020 10:51 AM

## 2020-08-30 ENCOUNTER — Telehealth: Payer: BC Managed Care – PPO | Admitting: Obstetrics & Gynecology

## 2020-09-16 ENCOUNTER — Ambulatory Visit: Payer: BC Managed Care – PPO | Admitting: Advanced Practice Midwife

## 2020-09-29 ENCOUNTER — Ambulatory Visit (INDEPENDENT_AMBULATORY_CARE_PROVIDER_SITE_OTHER): Payer: BC Managed Care – PPO | Admitting: Obstetrics and Gynecology

## 2020-09-29 ENCOUNTER — Encounter: Payer: Self-pay | Admitting: Obstetrics and Gynecology

## 2020-09-29 ENCOUNTER — Other Ambulatory Visit: Payer: Self-pay

## 2020-09-29 ENCOUNTER — Other Ambulatory Visit (HOSPITAL_COMMUNITY)
Admission: RE | Admit: 2020-09-29 | Discharge: 2020-09-29 | Disposition: A | Discharge status: Home / Self Care | Payer: BC Managed Care – PPO | Source: Ambulatory Visit | Attending: Obstetrics and Gynecology | Admitting: Obstetrics and Gynecology

## 2020-09-29 VITALS — BP 114/70 | HR 117 | Ht 68.0 in | Wt 172.2 lb

## 2020-09-29 DIAGNOSIS — Z01419 Encounter for gynecological examination (general) (routine) without abnormal findings: Secondary | ICD-10-CM | POA: Insufficient documentation

## 2020-09-29 DIAGNOSIS — Z3009 Encounter for other general counseling and advice on contraception: Secondary | ICD-10-CM

## 2020-09-29 NOTE — Progress Notes (Signed)
Post Partum Visit Note  Hannah Peters is a 30 y.o. [phone] female who presents for a postpartum visit. She is 6 weeks postpartum following a normal spontaneous vaginal delivery.  I have fully reviewed the prenatal and intrapartum course. The delivery was at 37 47  gestational weeks d/t PEC.  Anesthesia: epidural. Postpartum course has been unremarkable. Baby is doing well. Baby is feeding by bottle. Bleeding no bleeding. Bowel function is normal. Bladder function is normal. Patient is sexually active. Contraception method is condoms. Postpartum depression screening: negative.   Upstream - 09/29/20 1451      Pregnancy Intention Screening   Does the patient want to become pregnant in the next year? No    Does the patient's partner want to become pregnant in the next year? No    Would the patient like to discuss contraceptive options today? Yes      Contraception Wrap Up   Current Method Female Condom    End Method Unknown/Not Reported    Contraception Counseling Provided Yes          The pregnancy intention screening data noted above was reviewed. Potential methods of contraception were discussed. The patient elected to proceed with Female Sterilization.    Edinburgh Postnatal Depression Scale - 09/29/20 1451      Edinburgh Postnatal Depression Scale:  In the Past 7 Days   I have been able to laugh and see the funny side of things. 0    I have looked forward with enjoyment to things. 0    I have blamed myself unnecessarily when things went wrong. 2    I have been anxious or worried for no good reason. 2    I have felt scared or panicky for no good reason. 1    Things have been getting on top of me. 0    I have been so unhappy that I have had difficulty sleeping. 0    I have felt sad or miserable. 1    I have been so unhappy that I have been crying. 0    The thought of harming myself has occurred to me. 0    Edinburgh Postnatal Depression Scale Total 6           Health  Maintenance Due  Topic Date Due  . COVID-19 Vaccine (1) Never done  . PAP SMEAR-Modifier  09/19/2020    Medical record reviewed  Review of Systems Pertinent items noted in HPI and remainder of comprehensive ROS otherwise negative.  Objective:  BP 114/70 (BP Location: Right Arm, Patient Position: Sitting, Cuff Size: Normal)   Pulse (!) 117   Ht 5\' 8"  (1.727 m)   Wt 172 lb 3.2 oz (78.1 kg)   LMP 09/15/2020 (Exact Date)   Breastfeeding No   BMI 26.18 kg/m    General:  alert   Breasts:  not indicated  Lungs: clear to auscultation bilaterally  Heart:  regular rate and rhythm, S1, S2 normal, no murmur, click, rub or gallop  Abdomen: soft, non-tender; bowel sounds normal; no masses,  no organomegaly   Wound well approximated incision  GU exam:  normal, pap smear collected       Assessment:    1. Postpartum care following vaginal delivery Nl exam  2. Encounter for gynecological examination with Papanicolaou smear of cervix HM  3. Unwanted fertility Bilateral salpingectomy reviewed with pt. Will schedule. Pt has been sexual active since delivery. Advised to reframe from IC x 2 weeks and return  for Depo Provera      Plan:   Essential components of care per ACOG recommendations:  1.  Mood and well being: Patient with negative depression screening today. Reviewed local resources for support.  - Patient tobacco use? No.   - hx of drug use? No.    2. Infant care and feeding:  -Patient currently breastmilk feeding? No.  -Social determinants of health (SDOH) reviewed in EPIC. No concerns  3. Sexuality, contraception and birth spacing - Patient does not want a pregnancy in the next year.  Desired family size is completed.  - Reviewed forms of contraception in tiered fashion. Patient desired bilateral tubal ligation today.   - Discussed birth spacing of 18 months  4. Sleep and fatigue -Encouraged family/partner/community support of 4 hrs of uninterrupted sleep to help with  mood and fatigue  5. Physical Recovery  - Discussed patients delivery and complications. She describes her labor as good. - Patient had a Vaginal, no problems at delivery. Patient had a 2nd degree laceration. Perineal healing reviewed. Patient expressed understanding - Patient has urinary incontinence? No. - Patient is safe to resume physical and sexual activity  6.  Health Maintenance - HM due items addressed Yes - Last pap smear  Diagnosis  Date Value Ref Range Status  09/19/2017   Final   NEGATIVE FOR INTRAEPITHELIAL LESIONS OR MALIGNANCY.   Pap smear done at today's visit.  -Breast Cancer screening indicated? No.   7. Chronic Disease/Pregnancy Condition follow up: None  - PCP follow up  Hermina Staggers, MD [facility], [facility]

## 2020-09-29 NOTE — Patient Instructions (Signed)
Health Maintenance, Female Adopting a healthy lifestyle and getting preventive care are important in promoting health and wellness. Ask your health care provider about:  The right schedule for you to have regular tests and exams.  Things you can do on your own to prevent diseases and keep yourself healthy. What should I know about diet, weight, and exercise? Eat a healthy diet  Eat a diet that includes plenty of vegetables, fruits, low-fat dairy products, and lean protein.  Do not eat a lot of foods that are high in solid fats, added sugars, or sodium.   Maintain a healthy weight Body mass index (BMI) is used to identify weight problems. It estimates body fat based on height and weight. Your health care provider can help determine your BMI and help you achieve or maintain a healthy weight. Get regular exercise Get regular exercise. This is one of the most important things you can do for your health. Most adults should:  Exercise for at least 150 minutes each week. The exercise should increase your heart rate and make you sweat (moderate-intensity exercise).  Do strengthening exercises at least twice a week. This is in addition to the moderate-intensity exercise.  Spend less time sitting. Even light physical activity can be beneficial. Watch cholesterol and blood lipids Have your blood tested for lipids and cholesterol at 31 years of age, then have this test every 5 years. Have your cholesterol levels checked more often if:  Your lipid or cholesterol levels are high.  You are older than 31 years of age.  You are at high risk for heart disease. What should I know about cancer screening? Depending on your health history and family history, you may need to have cancer screening at various ages. This may include screening for:  Breast cancer.  Cervical cancer.  Colorectal cancer.  Skin cancer.  Lung cancer. What should I know about heart disease, diabetes, and high blood  pressure? Blood pressure and heart disease  High blood pressure causes heart disease and increases the risk of stroke. This is more likely to develop in people who have high blood pressure readings, are of African descent, or are overweight.  Have your blood pressure checked: ? Every 3-5 years if you are 18-39 years of age. ? Every year if you are 40 years old or older. Diabetes Have regular diabetes screenings. This checks your fasting blood sugar level. Have the screening done:  Once every three years after age 40 if you are at a normal weight and have a low risk for diabetes.  More often and at a younger age if you are overweight or have a high risk for diabetes. What should I know about preventing infection? Hepatitis B If you have a higher risk for hepatitis B, you should be screened for this virus. Talk with your health care provider to find out if you are at risk for hepatitis B infection. Hepatitis C Testing is recommended for:  Everyone born from 1945 through 1965.  Anyone with known risk factors for hepatitis C. Sexually transmitted infections (STIs)  Get screened for STIs, including gonorrhea and chlamydia, if: ? You are sexually active and are younger than 31 years of age. ? You are older than 31 years of age and your health care provider tells you that you are at risk for this type of infection. ? Your sexual activity has changed since you were last screened, and you are at increased risk for chlamydia or gonorrhea. Ask your health care provider   if you are at risk.  Ask your health care provider about whether you are at high risk for HIV. Your health care provider may recommend a prescription medicine to help prevent HIV infection. If you choose to take medicine to prevent HIV, you should first get tested for HIV. You should then be tested every 3 months for as long as you are taking the medicine. Pregnancy  If you are about to stop having your period (premenopausal) and  you may become pregnant, seek counseling before you get pregnant.  Take 400 to 800 micrograms (mcg) of folic acid every day if you become pregnant.  Ask for birth control (contraception) if you want to prevent pregnancy. Osteoporosis and menopause Osteoporosis is a disease in which the bones lose minerals and strength with aging. This can result in bone fractures. If you are 65 years old or older, or if you are at risk for osteoporosis and fractures, ask your health care provider if you should:  Be screened for bone loss.  Take a calcium or vitamin D supplement to lower your risk of fractures.  Be given hormone replacement therapy (HRT) to treat symptoms of menopause. Follow these instructions at home: Lifestyle  Do not use any products that contain nicotine or tobacco, such as cigarettes, e-cigarettes, and chewing tobacco. If you need help quitting, ask your health care provider.  Do not use street drugs.  Do not share needles.  Ask your health care provider for help if you need support or information about quitting drugs. Alcohol use  Do not drink alcohol if: ? Your health care provider tells you not to drink. ? You are pregnant, may be pregnant, or are planning to become pregnant.  If you drink alcohol: ? Limit how much you use to 0-1 drink a day. ? Limit intake if you are breastfeeding.  Be aware of how much alcohol is in your drink. In the [place] one drink equals one 12 oz bottle of beer (355 mL), one 5 oz glass of wine (148 mL), or one 1 oz glass of hard liquor (44 mL). General instructions  Schedule regular health, dental, and eye exams.  Stay current with your vaccines.  Tell your health care provider if: ? You often feel depressed. ? You have ever been abused or do not feel safe at home. Summary  Adopting a healthy lifestyle and getting preventive care are important in promoting health and wellness.  Follow your health care provider's instructions about healthy  diet, exercising, and getting tested or screened for diseases.  Follow your health care provider's instructions on monitoring your cholesterol and blood pressure. This information is not intended to replace advice given to you by your health care provider. Make sure you discuss any questions you have with your health care provider. Document Revised: 04/24/2018 Document Reviewed: 04/24/2018 Elsevier Patient Education  2021 [employer]  

## 2020-10-04 LAB — CYTOLOGY - PAP
Chlamydia: NEGATIVE
Comment: NEGATIVE
Comment: NEGATIVE
Comment: NORMAL
Diagnosis: NEGATIVE
High risk HPV: NEGATIVE
Neisseria Gonorrhea: NEGATIVE

## 2020-10-28 ENCOUNTER — Telehealth: Payer: Self-pay | Admitting: Adult Health

## 2020-10-28 NOTE — Telephone Encounter (Signed)
Pt had PAP at postpartum visit & was told to call when she started her cycle to come get depo shot Pt started cycle 10/27/2020  Can we order depo so she can come tomorrow for 1st injection  Please advise & notify pt    [facility]

## 2020-10-29 ENCOUNTER — Ambulatory Visit: Payer: BC Managed Care – PPO

## 2020-10-29 ENCOUNTER — Encounter: Payer: Self-pay | Admitting: Obstetrics & Gynecology

## 2020-11-02 ENCOUNTER — Ambulatory Visit: Payer: BC Managed Care – PPO

## 2020-11-22 NOTE — Patient Instructions (Signed)
Hannah Peters  11/22/2020     @   Your procedure is scheduled on   11/24/2020.   Report to [facility] at  0900  A.M.    Call this number if you have problems the morning of surgery:  [phone]   Remember:  Do not eat after midnight.   You may drink clear liquids until  0615  AM.  Clear liquids allowed are:                    Water, Juice (non-citric and without pulp - diabetics please choose diet or no sugar options), Carbonated beverages - (diabetics please choose diet or no sugar options), Clear Tea, Black Coffee only (no creamer, milk or cream including half and half), Plain Jell-O only (diabetics please choose diet or no sugar options), Gatorade (diabetics please choose diet or no sugar options), and Plain Popsicles only.  At 0615 , drink your carb drink and DO NOT drink anything else after that.    Take these medicines the morning of surgery with A SIP OF WATER       lexapro.     Do not wear jewelry, make-up or nail polish.  Do not wear lotions, powders, or perfumes, or deodorant.  Do not shave 48 hours prior to surgery.  Men may shave face and neck.  Do not bring valuables to the hospital.  [facility] is not responsible for any belongings or valuables.  Contacts, dentures or bridgework may not be worn into surgery.  Leave your suitcase in the car.  After surgery it may be brought to your room.  For patients admitted to the hospital, discharge time will be determined by your treatment team.  Patients discharged the day of surgery will not be allowed to drive home and must have someone with them for 24 hours.    Special instructions:    DO NOT smoke tobacco or vape for 24 hours before your procedure.  Please read over the following fact sheets that you were given. Anesthesia Post-op Instructions and Care and Recovery After Surgery      Salpingectomy, Care After The following information offers guidance on how to care for yourself  after your procedure. Your health care provider may also give you more specific instructions. If you have problems or questions, contact your health careprovider. What can I expect after the procedure? After the procedure, it is common to have: Pain in your abdomen. Light vaginal bleeding (spotting) for a few days. Tiredness. Your recovery time will depend on which method was used for your surgery. Follow these instructions at home: Medicines Take over-the-counter and prescription medicines only as told by your health care provider. Ask your health care provider if the medicine prescribed to you: Requires you to avoid driving or using machinery. Can cause constipation. You may need to take actions to prevent or treat constipation, such as: Drink enough fluid to keep your urine pale yellow. Take over-the-counter or prescription medicines. Eat foods that are high in fiber, such as beans, whole grains, and fresh fruits and vegetables. Limit foods that are high in fat and processed sugars, such as fried or sweet foods. Incision care  Follow instructions from your health care provider about how to take care of your incision or incisions. Make sure you: Wash your hands with soap and water for at least 20 seconds before and after you change your bandage (dressing). If soap and water  are not available, use hand sanitizer. Change or remove your dressing as told by your health care provider. Leave stitches (sutures), skin glue, staples, or adhesive strips in place. These skin closures may need to stay in place for 2 weeks or longer. If adhesive strip edges start to loosen and curl up, you may trim the loose edges. Do not remove adhesive strips completely unless your health care provider tells you to do that. Keep your dressing clean and dry. Check your incision area every day for signs of infection. Check for: Redness, swelling, or pain that gets worse. Fluid or blood. Warmth. Pus or a bad  smell.  Activity Rest as told by your health care provider. Avoid sitting for a long time without moving. Get up to take short walks every 1-2 hours. This is important to improve blood flow and breathing. Ask for help if you feel weak or unsteady. Return to your normal activities as told by your health care provider. Ask your health care provider what activities are safe for you. Do not drive until your health care provider says that it is safe. Do not lift anything that is heavier than 10 lb (4.5 kg), or the limit that you are told, until your health care provider says that it is safe. This may last for 2-6 weeks depending on your surgery. Do not douche, use tampons, or have sex until your health care provider approves. General instructions Do not use any products that contain nicotine or tobacco. These products include cigarettes, chewing tobacco, and vaping devices, such as e-cigarettes. These can delay healing after surgery. If you need help quitting, ask your health care provider. Wear compression stockings as told by your health care provider. These stockings help to prevent blood clots and reduce swelling in your legs. Do not take baths, swim, or use a hot tub until your health care provider approves. You may take showers. Keep all follow-up visits. This is important. Contact a health care provider if: You have pain when you urinate. You have redness, swelling, or more pain around an incision or an incision feels warm to the touch. You have pus, fluid, blood, or a bad smell coming from an incision or an incision starts to open. You have a fever. You have abdominal pain that gets worse or does not get better with medicine. You have a rash. You feel light-headed, have nausea and vomiting, or both. Get help right away if: You have pain in your chest or leg. You develop shortness of breath. You faint. You have increased or heavy vaginal bleeding, such as soaking a sanitary napkin in an  hour. These symptoms may represent a serious problem that is an emergency. Do not wait to see if the symptoms will go away. Get medical help right away. Call your local emergency services (911 in the [place] Do not drive yourself to the hospital. Summary After the procedure, it is common to feel tired, have pain in your abdomen, and have light vaginal bleeding for a few days. Follow instructions from your health care provider about how to take care of your incision or incisions. Return to your normal activities as told by your health care provider. Ask your health care provider what activities are safe for you. Do not douche, use tampons, or have sex until your health care provider approves. Keep all follow-up visits. This is important. This information is not intended to replace advice given to you by your health care provider. Make sure you discuss any  questions you have with your healthcare provider. Document Revised: 03/23/2020 Document Reviewed: 03/23/2020 Elsevier Patient Education  2022 [employer] General Anesthesia, Adult, Care After This sheet gives you information about how to care for yourself after your procedure. Your health care provider may also give you more specific instructions. If you have problems or questions, contact your health careprovider. What can I expect after the procedure? After the procedure, the following side effects are common: Pain or discomfort at the IV site. Nausea. Vomiting. Sore throat. Trouble concentrating. Feeling cold or chills. Feeling weak or tired. Sleepiness and fatigue. Soreness and body aches. These side effects can affect parts of the body that were not involved in surgery. Follow these instructions at home: For the time period you were told by your health care provider:  Rest. Do not participate in activities where you could fall or become injured. Do not drive or use machinery. Do not drink alcohol. Do not take sleeping pills or  medicines that cause drowsiness. Do not make important decisions or sign legal documents. Do not take care of children on your own.  Eating and drinking Follow any instructions from your health care provider about eating or drinking restrictions. When you feel hungry, start by eating small amounts of foods that are soft and easy to digest (bland), such as toast. Gradually return to your regular diet. Drink enough fluid to keep your urine pale yellow. If you vomit, rehydrate by drinking water, juice, or clear broth. General instructions If you have sleep apnea, surgery and certain medicines can increase your risk for breathing problems. Follow instructions from your health care provider about wearing your sleep device: Anytime you are sleeping, including during daytime naps. While taking prescription pain medicines, sleeping medicines, or medicines that make you drowsy. Have a responsible adult stay with you for the time you are told. It is important to have someone help care for you until you are awake and alert. Return to your normal activities as told by your health care provider. Ask your health care provider what activities are safe for you. Take over-the-counter and prescription medicines only as told by your health care provider. If you smoke, do not smoke without supervision. Keep all follow-up visits as told by your health care provider. This is important. Contact a health care provider if: You have nausea or vomiting that does not get better with medicine. You cannot eat or drink without vomiting. You have pain that does not get better with medicine. You are unable to pass urine. You develop a skin rash. You have a fever. You have redness around your IV site that gets worse. Get help right away if: You have difficulty breathing. You have chest pain. You have blood in your urine or stool, or you vomit blood. Summary After the procedure, it is common to have a sore throat or  nausea. It is also common to feel tired. Have a responsible adult stay with you for the time you are told. It is important to have someone help care for you until you are awake and alert. When you feel hungry, start by eating small amounts of foods that are soft and easy to digest (bland), such as toast. Gradually return to your regular diet. Drink enough fluid to keep your urine pale yellow. Return to your normal activities as told by your health care provider. Ask your health care provider what activities are safe for you. This information is not intended to replace advice given to you by  your health care provider. Make sure you discuss any questions you have with your healthcare provider. Document Revised: 01/15/2020 Document Reviewed: 08/14/2019 Elsevier Patient Education  2022 [employer] How to Use Chlorhexidine for Bathing Chlorhexidine gluconate (CHG) is a germ-killing (antiseptic) solution that is used to clean the skin. It can get rid of the bacteria that normally live on the skin and can keep them away for about 24 hours. To clean your skin with CHG, you may be given: A CHG solution to use in the shower or as part of a sponge bath. A prepackaged cloth that contains CHG. Cleaning your skin with CHG may help lower the risk for infection: While you are staying in the intensive care unit of the hospital. If you have a vascular access, such as a central line, to provide short-term or long-term access to your veins. If you have a catheter to drain urine from your bladder. If you are on a ventilator. A ventilator is a machine that helps you breathe by moving air in and out of your lungs. After surgery. What are the risks? Risks of using CHG include: A skin reaction. Hearing loss, if CHG gets in your ears. Eye injury, if CHG gets in your eyes and is not rinsed out. The CHG product catching fire. Make sure that you avoid smoking and flames after applying CHG to your skin. Do not use  CHG: If you have a chlorhexidine allergy or have previously reacted to chlorhexidine. On babies younger than 592 months of age. How to use CHG solution Use CHG only as told by your health care provider, and follow the instructions on the label. Use the full amount of CHG as directed. Usually, this is one bottle. During a shower Follow these steps when using CHG solution during a shower (unless your health care provider gives you different instructions): Start the shower. Use your normal soap and shampoo to wash your face and hair. Turn off the shower or move out of the shower stream. Pour the CHG onto a clean washcloth. Do not use any type of brush or rough-edged sponge. Starting at your neck, lather your body down to your toes. Make sure you follow these instructions: If you will be having surgery, pay special attention to the part of your body where you will be having surgery. Scrub this area for at least 1 minute. Do not use CHG on your head or face. If the solution gets into your ears or eyes, rinse them well with water. Avoid your genital area. Avoid any areas of skin that have broken skin, cuts, or scrapes. Scrub your back and under your arms. Make sure to wash skin folds. Let the lather sit on your skin for 1-2 minutes or as long as told by your health care provider. Thoroughly rinse your entire body in the shower. Make sure that all body creases and crevices are rinsed well. Dry off with a clean towel. Do not put any substances on your body afterward--such as powder, lotion, or perfume--unless you are told to do so by your health care provider. Only use lotions that are recommended by the manufacturer. Put on clean clothes or pajamas. If it is the night before your surgery, sleep in clean sheets.  During a sponge bath Follow these steps when using CHG solution during a sponge bath (unless your health care provider gives you different instructions): Use your normal soap and shampoo to  wash your face and hair. Pour the CHG onto a clean  washcloth. Starting at your neck, lather your body down to your toes. Make sure you follow these instructions: If you will be having surgery, pay special attention to the part of your body where you will be having surgery. Scrub this area for at least 1 minute. Do not use CHG on your head or face. If the solution gets into your ears or eyes, rinse them well with water. Avoid your genital area. Avoid any areas of skin that have broken skin, cuts, or scrapes. Scrub your back and under your arms. Make sure to wash skin folds. Let the lather sit on your skin for 1-2 minutes or as long as told by your health care provider. Using a different clean, wet washcloth, thoroughly rinse your entire body. Make sure that all body creases and crevices are rinsed well. Dry off with a clean towel. Do not put any substances on your body afterward--such as powder, lotion, or perfume--unless you are told to do so by your health care provider. Only use lotions that are recommended by the manufacturer. Put on clean clothes or pajamas. If it is the night before your surgery, sleep in clean sheets. How to use CHG prepackaged cloths Only use CHG cloths as told by your health care provider, and follow the instructions on the label. Use the CHG cloth on clean, dry skin. Do not use the CHG cloth on your head or face unless your health care provider tells you to. When washing with the CHG cloth: Avoid your genital area. Avoid any areas of skin that have broken skin, cuts, or scrapes. Before surgery Follow these steps when using a CHG cloth to clean before surgery (unless your health care provider gives you different instructions): Using the CHG cloth, vigorously scrub the part of your body where you will be having surgery. Scrub using a back-and-forth motion for 3 minutes. The area on your body should be completely wet with CHG when you are done scrubbing. Do not rinse.  Discard the cloth and let the area air-dry. Do not put any substances on the area afterward, such as powder, lotion, or perfume. Put on clean clothes or pajamas. If it is the night before your surgery, sleep in clean sheets.  For general bathing Follow these steps when using CHG cloths for general bathing (unless your health care provider gives you different instructions). Use a separate CHG cloth for each area of your body. Make sure you wash between any folds of skin and between your fingers and toes. Wash your body in the following order, switching to a new cloth after each step: The front of your neck, shoulders, and chest. Both of your arms, under your arms, and your hands. Your stomach and groin area, avoiding the genitals. Your right leg and foot. Your left leg and foot. The back of your neck, your back, and your buttocks. Do not rinse. Discard the cloth and let the area air-dry. Do not put any substances on your body afterward--such as powder, lotion, or perfume--unless you are told to do so by your health care provider. Only use lotions that are recommended by the manufacturer. Put on clean clothes or pajamas. Contact a health care provider if: Your skin gets irritated after scrubbing. You have questions about using your solution or cloth. Get help right away if: Your eyes become very red or swollen. Your eyes itch badly. Your skin itches badly and is red or swollen. Your hearing changes. You have trouble seeing. You have swelling or tingling  in your mouth or throat. You have trouble breathing. You swallow any chlorhexidine. Summary Chlorhexidine gluconate (CHG) is a germ-killing (antiseptic) solution that is used to clean the skin. Cleaning your skin with CHG may help to lower your risk for infection. You may be given CHG to use for bathing. It may be in a bottle or in a prepackaged cloth to use on your skin. Carefully follow your health care provider's instructions and the  instructions on the product label. Do not use CHG if you have a chlorhexidine allergy. Contact your health care provider if your skin gets irritated after scrubbing. This information is not intended to replace advice given to you by your health care provider. Make sure you discuss any questions you have with your healthcare provider. Document Revised: 09/12/2019 Document Reviewed: 10/17/2019 Elsevier Patient Education  2022 [employer].

## 2020-11-23 ENCOUNTER — Other Ambulatory Visit: Payer: Self-pay | Admitting: Obstetrics & Gynecology

## 2020-11-23 ENCOUNTER — Other Ambulatory Visit: Payer: Self-pay

## 2020-11-23 ENCOUNTER — Encounter (HOSPITAL_COMMUNITY): Payer: Self-pay

## 2020-11-23 ENCOUNTER — Other Ambulatory Visit (HOSPITAL_COMMUNITY): Payer: BC Managed Care – PPO | Attending: Obstetrics & Gynecology

## 2020-11-23 ENCOUNTER — Encounter (HOSPITAL_COMMUNITY)
Admission: RE | Admit: 2020-11-23 | Discharge: 2020-11-23 | Disposition: A | Discharge status: Home / Self Care | Payer: BC Managed Care – PPO | Source: Ambulatory Visit | Attending: Obstetrics & Gynecology | Admitting: Obstetrics & Gynecology

## 2020-11-23 DIAGNOSIS — Z91040 Latex allergy status: Secondary | ICD-10-CM | POA: Diagnosis not present

## 2020-11-23 DIAGNOSIS — Z01812 Encounter for preprocedural laboratory examination: Secondary | ICD-10-CM | POA: Insufficient documentation

## 2020-11-23 DIAGNOSIS — Z823 Family history of stroke: Secondary | ICD-10-CM | POA: Diagnosis not present

## 2020-11-23 DIAGNOSIS — Z888 Allergy status to other drugs, medicaments and biological substances status: Secondary | ICD-10-CM | POA: Diagnosis not present

## 2020-11-23 DIAGNOSIS — F17210 Nicotine dependence, cigarettes, uncomplicated: Secondary | ICD-10-CM | POA: Diagnosis not present

## 2020-11-23 DIAGNOSIS — Z833 Family history of diabetes mellitus: Secondary | ICD-10-CM | POA: Diagnosis not present

## 2020-11-23 DIAGNOSIS — Z808 Family history of malignant neoplasm of other organs or systems: Secondary | ICD-10-CM | POA: Diagnosis not present

## 2020-11-23 DIAGNOSIS — Z8249 Family history of ischemic heart disease and other diseases of the circulatory system: Secondary | ICD-10-CM | POA: Diagnosis not present

## 2020-11-23 DIAGNOSIS — N838 Other noninflammatory disorders of ovary, fallopian tube and broad ligament: Secondary | ICD-10-CM | POA: Diagnosis not present

## 2020-11-23 DIAGNOSIS — Z79899 Other long term (current) drug therapy: Secondary | ICD-10-CM | POA: Diagnosis not present

## 2020-11-23 DIAGNOSIS — Z801 Family history of malignant neoplasm of trachea, bronchus and lung: Secondary | ICD-10-CM | POA: Diagnosis not present

## 2020-11-23 DIAGNOSIS — Z302 Encounter for sterilization: Secondary | ICD-10-CM | POA: Diagnosis present

## 2020-11-23 HISTORY — DX: Depression, unspecified: F32.A

## 2020-11-23 LAB — RAPID HIV SCREEN (HIV 1/2 AB+AG)
HIV 1/2 Antibodies: NONREACTIVE
HIV-1 P24 Antigen - HIV24: NONREACTIVE

## 2020-11-23 LAB — CBC
HCT: 34.7 % — ABNORMAL LOW (ref 36.0–46.0)
Hemoglobin: 10.1 g/dL — ABNORMAL LOW (ref 12.0–15.0)
MCH: 21.1 pg — ABNORMAL LOW (ref 26.0–34.0)
MCHC: 29.1 g/dL — ABNORMAL LOW (ref 30.0–36.0)
MCV: 72.4 fL — ABNORMAL LOW (ref 80.0–100.0)
Platelets: 381 10*3/uL (ref 150–400)
RBC: 4.79 MIL/uL (ref 3.87–5.11)
RDW: 18.6 % — ABNORMAL HIGH (ref 11.5–15.5)
WBC: 6.3 10*3/uL (ref 4.0–10.5)
nRBC: 0 % (ref 0.0–0.2)

## 2020-11-23 LAB — URINALYSIS, ROUTINE W REFLEX MICROSCOPIC
Bilirubin Urine: NEGATIVE
Glucose, UA: NEGATIVE mg/dL
Ketones, ur: NEGATIVE mg/dL
Nitrite: NEGATIVE
Protein, ur: NEGATIVE mg/dL
Specific Gravity, Urine: 1.02 (ref 1.005–1.030)
pH: 6 (ref 5.0–8.0)

## 2020-11-23 LAB — COMPREHENSIVE METABOLIC PANEL
ALT: 12 U/L (ref 0–44)
AST: 17 U/L (ref 15–41)
Albumin: 4 g/dL (ref 3.5–5.0)
Alkaline Phosphatase: 69 U/L (ref 38–126)
Anion gap: 6 (ref 5–15)
BUN: 9 mg/dL (ref 6–20)
CO2: 24 mmol/L (ref 22–32)
Calcium: 8.4 mg/dL — ABNORMAL LOW (ref 8.9–10.3)
Chloride: 106 mmol/L (ref 98–111)
Creatinine, Ser: 0.56 mg/dL (ref 0.44–1.00)
GFR, Estimated: >60 mL/min (ref >60)
Glucose, Bld: 88 mg/dL (ref 70–99)
Potassium: 3.6 mmol/L (ref 3.5–5.1)
Sodium: 136 mmol/L (ref 135–145)
Total Bilirubin: 0.5 mg/dL (ref 0.3–1.2)
Total Protein: 7.5 g/dL (ref 6.5–8.1)

## 2020-11-23 LAB — HCG, QUANTITATIVE, PREGNANCY: hCG, Beta Chain, Quant, S: 4 m[IU]/mL (ref <5)

## 2020-11-24 ENCOUNTER — Encounter (HOSPITAL_COMMUNITY)
Admission: RE | Disposition: A | Discharge status: Home / Self Care | Payer: Self-pay | Source: Home / Self Care | Attending: Obstetrics & Gynecology

## 2020-11-24 ENCOUNTER — Ambulatory Visit (HOSPITAL_COMMUNITY): Payer: BC Managed Care – PPO | Admitting: Anesthesiology

## 2020-11-24 ENCOUNTER — Ambulatory Visit (HOSPITAL_COMMUNITY)
Admission: RE | Admit: 2020-11-24 | Discharge: 2020-11-24 | Disposition: A | Discharge status: Home / Self Care | Payer: BC Managed Care – PPO | Attending: Obstetrics & Gynecology | Admitting: Obstetrics & Gynecology

## 2020-11-24 ENCOUNTER — Encounter (HOSPITAL_COMMUNITY): Payer: Self-pay | Admitting: Obstetrics & Gynecology

## 2020-11-24 DIAGNOSIS — Z801 Family history of malignant neoplasm of trachea, bronchus and lung: Secondary | ICD-10-CM | POA: Insufficient documentation

## 2020-11-24 DIAGNOSIS — Z833 Family history of diabetes mellitus: Secondary | ICD-10-CM | POA: Insufficient documentation

## 2020-11-24 DIAGNOSIS — Z302 Encounter for sterilization: Secondary | ICD-10-CM | POA: Diagnosis not present

## 2020-11-24 DIAGNOSIS — Z91040 Latex allergy status: Secondary | ICD-10-CM | POA: Insufficient documentation

## 2020-11-24 DIAGNOSIS — Z888 Allergy status to other drugs, medicaments and biological substances status: Secondary | ICD-10-CM | POA: Insufficient documentation

## 2020-11-24 DIAGNOSIS — Z8249 Family history of ischemic heart disease and other diseases of the circulatory system: Secondary | ICD-10-CM | POA: Insufficient documentation

## 2020-11-24 DIAGNOSIS — Z808 Family history of malignant neoplasm of other organs or systems: Secondary | ICD-10-CM | POA: Insufficient documentation

## 2020-11-24 DIAGNOSIS — F17210 Nicotine dependence, cigarettes, uncomplicated: Secondary | ICD-10-CM | POA: Insufficient documentation

## 2020-11-24 DIAGNOSIS — Z823 Family history of stroke: Secondary | ICD-10-CM | POA: Insufficient documentation

## 2020-11-24 DIAGNOSIS — Z79899 Other long term (current) drug therapy: Secondary | ICD-10-CM | POA: Insufficient documentation

## 2020-11-24 DIAGNOSIS — N838 Other noninflammatory disorders of ovary, fallopian tube and broad ligament: Secondary | ICD-10-CM | POA: Insufficient documentation

## 2020-11-24 HISTORY — PX: LAPAROSCOPIC BILATERAL SALPINGECTOMY: SHX5889

## 2020-11-24 SURGERY — SALPINGECTOMY, BILATERAL, LAPAROSCOPIC
Anesthesia: General | Site: Abdomen | Laterality: Bilateral

## 2020-11-24 MED ORDER — POVIDONE-IODINE 10 % EX SWAB: 2.0000 "application " | Freq: Once | CUTANEOUS | Status: DC

## 2020-11-24 MED ORDER — LIDOCAINE HCL (CARDIAC) PF 50 MG/5ML IV SOSY
PREFILLED_SYRINGE | INTRAVENOUS | Status: DC | PRN
  Administered 2020-11-24: 75 mg via INTRAVENOUS

## 2020-11-24 MED ORDER — SODIUM CHLORIDE 0.9 % IR SOLN
Status: DC | PRN
  Administered 2020-11-24: 1000 mL

## 2020-11-24 MED ORDER — ONDANSETRON 8 MG PO TBDP: 8.0000 mg | ORAL_TABLET | Freq: Three times a day (TID) | ORAL | 0 refills | Status: AC | PRN

## 2020-11-24 MED ORDER — FENTANYL CITRATE (PF) 100 MCG/2ML IJ SOLN
INTRAMUSCULAR | Status: AC
  Filled 2020-11-24: qty 2

## 2020-11-24 MED ORDER — KETOROLAC TROMETHAMINE 30 MG/ML IJ SOLN
INTRAMUSCULAR | Status: AC
  Filled 2020-11-24: qty 1

## 2020-11-24 MED ORDER — FENTANYL CITRATE (PF) 100 MCG/2ML IJ SOLN
INTRAMUSCULAR | Status: DC | PRN
  Administered 2020-11-24 (×3): 50 ug via INTRAVENOUS

## 2020-11-24 MED ORDER — CEFAZOLIN SODIUM-DEXTROSE 2-4 GM/100ML-% IV SOLN
INTRAVENOUS | Status: AC
  Filled 2020-11-24: qty 100

## 2020-11-24 MED ORDER — CHLORHEXIDINE GLUCONATE 0.12 % MT SOLN: 15.0000 mL | Freq: Once | OROMUCOSAL | Status: DC

## 2020-11-24 MED ORDER — LACTATED RINGERS IV SOLN: INTRAVENOUS | Status: DC

## 2020-11-24 MED ORDER — ORAL CARE MOUTH RINSE: 15.0000 mL | Freq: Once | OROMUCOSAL | Status: DC

## 2020-11-24 MED ORDER — BUPIVACAINE LIPOSOME 1.3 % IJ SUSP
20.0000 mL | Freq: Once | INTRAMUSCULAR | Status: DC
  Filled 2020-11-24: qty 20

## 2020-11-24 MED ORDER — ONDANSETRON HCL 4 MG/2ML IJ SOLN
INTRAMUSCULAR | Status: DC | PRN
  Administered 2020-11-24: 4 mg via INTRAVENOUS

## 2020-11-24 MED ORDER — CEFAZOLIN SODIUM-DEXTROSE 2-4 GM/100ML-% IV SOLN
2.0000 g | INTRAVENOUS | Status: AC
  Administered 2020-11-24: 2 g via INTRAVENOUS

## 2020-11-24 MED ORDER — HYDROCODONE-ACETAMINOPHEN 5-325 MG PO TABS: 1.0000 | ORAL_TABLET | Freq: Four times a day (QID) | ORAL | 0 refills | Status: AC | PRN

## 2020-11-24 MED ORDER — KETOROLAC TROMETHAMINE 10 MG PO TABS: 10.0000 mg | ORAL_TABLET | Freq: Three times a day (TID) | ORAL | 0 refills | Status: AC | PRN

## 2020-11-24 MED ORDER — FENTANYL CITRATE (PF) 100 MCG/2ML IJ SOLN
25.0000 ug | INTRAMUSCULAR | Status: DC | PRN
  Administered 2020-11-24: 50 ug via INTRAVENOUS
  Filled 2020-11-24: qty 2

## 2020-11-24 MED ORDER — PROPOFOL 10 MG/ML IV BOLUS
INTRAVENOUS | Status: DC | PRN
  Administered 2020-11-24: 175 mg via INTRAVENOUS

## 2020-11-24 MED ORDER — MIDAZOLAM HCL 5 MG/5ML IJ SOLN
INTRAMUSCULAR | Status: DC | PRN
  Administered 2020-11-24: 2 mg via INTRAVENOUS

## 2020-11-24 MED ORDER — KETOROLAC TROMETHAMINE 30 MG/ML IJ SOLN
30.0000 mg | Freq: Once | INTRAMUSCULAR | Status: AC
  Administered 2020-11-24: 30 mg via INTRAVENOUS

## 2020-11-24 MED ORDER — BUPIVACAINE LIPOSOME 1.3 % IJ SUSP
INTRAMUSCULAR | Status: AC
  Filled 2020-11-24: qty 20

## 2020-11-24 MED ORDER — ROCURONIUM 10MG/ML (10ML) SYRINGE FOR MEDFUSION PUMP - OPTIME
INTRAVENOUS | Status: DC | PRN
  Administered 2020-11-24: 40 mg via INTRAVENOUS

## 2020-11-24 MED ORDER — ONDANSETRON HCL 4 MG/2ML IJ SOLN: 4.0000 mg | Freq: Once | INTRAMUSCULAR | Status: DC | PRN

## 2020-11-24 MED ORDER — MIDAZOLAM HCL 2 MG/2ML IJ SOLN
INTRAMUSCULAR | Status: AC
  Filled 2020-11-24: qty 2

## 2020-11-24 MED ORDER — SUGAMMADEX SODIUM 200 MG/2ML IV SOLN
INTRAVENOUS | Status: DC | PRN
  Administered 2020-11-24: 200 mg via INTRAVENOUS

## 2020-11-24 MED ORDER — LIDOCAINE HCL (PF) 2 % IJ SOLN
INTRAMUSCULAR | Status: AC
  Filled 2020-11-24: qty 5

## 2020-11-24 MED ORDER — BUPIVACAINE LIPOSOME 1.3 % IJ SUSP
INTRAMUSCULAR | Status: DC | PRN
  Administered 2020-11-24: 20 mL

## 2020-11-24 MED ORDER — CHLORHEXIDINE GLUCONATE 0.12 % MT SOLN
OROMUCOSAL | Status: AC
  Administered 2020-11-24: 15 mL
  Filled 2020-11-24: qty 15

## 2020-11-24 MED ORDER — ONDANSETRON HCL 4 MG/2ML IJ SOLN
INTRAMUSCULAR | Status: AC
  Filled 2020-11-24: qty 2

## 2020-11-24 SURGICAL SUPPLY — 39 items
ADH SKN CLS APL DERMABOND .7 (GAUZE/BANDAGES/DRESSINGS) ×1
BAG HAMPER (MISCELLANEOUS) ×2 IMPLANT
BLADE SURG SZ11 CARB STEEL (BLADE) ×2 IMPLANT
CLOTH BEACON ORANGE TIMEOUT ST (SAFETY) ×2 IMPLANT
COVER LIGHT HANDLE STERIS (MISCELLANEOUS) ×4 IMPLANT
DERMABOND ADVANCED (GAUZE/BANDAGES/DRESSINGS) ×1
DERMABOND ADVANCED .7 DNX12 (GAUZE/BANDAGES/DRESSINGS) ×1 IMPLANT
ELECT REM PT RETURN 9FT ADLT (ELECTROSURGICAL) ×2
ELECTRODE REM PT RTRN 9FT ADLT (ELECTROSURGICAL) ×1 IMPLANT
GAUZE 4X4 16PLY ~~LOC~~+RFID DBL (SPONGE) ×2 IMPLANT
GLOVE ECLIPSE 8.0 STRL XLNG CF (GLOVE) ×2 IMPLANT
GLOVE SRG 8 PF TXTR STRL LF DI (GLOVE) ×1 IMPLANT
GLOVE SURG UNDER POLY LF SZ7 (GLOVE) ×6 IMPLANT
GLOVE SURG UNDER POLY LF SZ8 (GLOVE) ×2
GOWN STRL REUS W/TWL LRG LVL3 (GOWN DISPOSABLE) ×2 IMPLANT
GOWN STRL REUS W/TWL XL LVL3 (GOWN DISPOSABLE) ×2 IMPLANT
INST SET LAPROSCOPIC GYN AP (KITS) ×2 IMPLANT
KIT TURNOVER CYSTO (KITS) ×2 IMPLANT
NDL HYPO 18GX1.5 BLUNT FILL (NEEDLE) ×1 IMPLANT
NDL HYPO 21X1.5 SAFETY (NEEDLE) ×1 IMPLANT
NDL INSUFFLATION 14GA 120MM (NEEDLE) ×1 IMPLANT
NEEDLE HYPO 18GX1.5 BLUNT FILL (NEEDLE) ×2 IMPLANT
NEEDLE HYPO 21X1.5 SAFETY (NEEDLE) ×2 IMPLANT
NEEDLE INSUFFLATION 14GA 120MM (NEEDLE) ×2 IMPLANT
PACK PERI GYN (CUSTOM PROCEDURE TRAY) ×2 IMPLANT
PAD ARMBOARD 7.5X6 YLW CONV (MISCELLANEOUS) ×2 IMPLANT
SET BASIN LINEN APH (SET/KITS/TRAYS/PACK) ×2 IMPLANT
SET TUBE SMOKE EVAC HIGH FLOW (TUBING) ×2 IMPLANT
SHEARS HARMONIC ACE PLUS 36CM (ENDOMECHANICALS) ×2 IMPLANT
SLEEVE ENDOPATH XCEL 5M (ENDOMECHANICALS) ×2 IMPLANT
SOL ANTI FOG 6CC (MISCELLANEOUS) ×1 IMPLANT
SOLUTION ANTI FOG 6CC (MISCELLANEOUS) ×1
SUT VICRYL 0 UR6 27IN ABS (SUTURE) ×2 IMPLANT
SUT VICRYL AB 3-0 FS1 BRD 27IN (SUTURE) ×4 IMPLANT
SYR 10ML LL (SYRINGE) ×2 IMPLANT
SYR 20ML LL LF (SYRINGE) ×4 IMPLANT
TROCAR ENDO BLADELESS 11MM (ENDOMECHANICALS) ×2 IMPLANT
TROCAR XCEL NON-BLD 5MMX100MML (ENDOMECHANICALS) ×2 IMPLANT
WARMER LAPAROSCOPE (MISCELLANEOUS) ×2 IMPLANT
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
## 2020-11-24 NOTE — Transfer of Care (Signed)
Immediate Anesthesia Transfer of Care Note  Patient: Hannah Peters  Procedure(s) Performed: LAPAROSCOPIC BILATERAL SALPINGECTOMY (Bilateral: Abdomen)  Patient Location: PACU  Anesthesia Type:General  Level of Consciousness: awake  Airway & Oxygen Therapy: Patient Spontanous Breathing  Post-op Assessment: Report given to RN  Post vital signs: Reviewed and stable  Last Vitals:  Vitals Value Taken Time  BP    Temp    Pulse 85 11/24/20 1019  Resp 20 11/24/20 1019  SpO2 97 % 11/24/20 1019  Vitals shown include unvalidated device data.  Last Pain:  Vitals:   11/24/20 0801  PainSc: 0-No pain         Complications: No notable events documented.

## 2020-11-24 NOTE — Op Note (Signed)
Preoperative Diagnosis:  1.  Multiparous female desires permanent sterilization                                          2.  Elects to have bilateral salpingectomy for ovarian cancer prophylaxis  Postoperative Diagnosis:  Same as above  Procedure:  Laparoscopic Bilateral Salpingectomy for the purpose of permanent sterilization  Surgeon:  Rockne Coons MD  Anaesthesia: general  Findings:  Patient had normal pelvic anatomy and no intraperitoneal abnormalities.  Description of Operation:  Patient was taken to the OR and placed into supine position where she underwent general anaesthesia.   She was placed in the dorsal lithotomy position and prepped and draped in the usual sterile fashion.   An incision was made in the umbilicus and dissection taken down to the rectus fascia. A Veres needle was used to insufflate the periotneal cavity. An 11 mm non bladed video laparoscope trocar was then placed under direct visualization without difficulty.   The above noted findings were observed.   Two additional 5 mm non bladed trocars were placed in the right and left lower quadrants under direct visualization without difficulty.   The Harmonic scalpel was employed and a salpingectomy of both the right and left tubes was performed.   The tubes were removed from the peritoneal cavity and sent to pathology.   There was good hemostasis bilaterally.   The fascia, peritoneum and subcutaneous tissue were closed using 0 vicryl.   All 3 skin incisions were closed using 3-0 vicryl in a subcuticular fashion.  Exparel 266 mg 20 cc was injected in the 3 incisional/trocar sites.  The patient was awakened from anaesthesia and taken to the PACU with all counts being correct x 3.   The patient received  2 gram of ancef andToradol 30 mg IV preoperatively.  Lazaro Arms 11/24/2020 10:13 AM

## 2020-11-24 NOTE — Anesthesia Preprocedure Evaluation (Signed)
Anesthesia Evaluation  Patient identified by MRN, date of birth, ID band Patient awake    Reviewed: Allergy & Precautions, H&P , NPO status , Patient's Chart, lab work & pertinent test results, reviewed documented beta blocker date and time   Airway Mallampati: II  TM Distance: >3 FB Neck ROM: full    Dental no notable dental hx.    Pulmonary neg pulmonary ROS, Current Smoker and Patient abstained from smoking.,    Pulmonary exam normal breath sounds clear to auscultation       Cardiovascular Exercise Tolerance: Good negative cardio ROS   Rhythm:regular Rate:Normal     Neuro/Psych Seizures -,  PSYCHIATRIC DISORDERS Anxiety Depression    GI/Hepatic negative GI ROS, Neg liver ROS,   Endo/Other  negative endocrine ROS  Renal/GU negative Renal ROS  negative genitourinary   Musculoskeletal   Abdominal   Peds  Hematology negative hematology ROS (+)   Anesthesia Other Findings   Reproductive/Obstetrics negative OB ROS                             Anesthesia Physical Anesthesia Plan  ASA: 2  Anesthesia Plan: General   Post-op Pain Management:    Induction:   PONV Risk Score and Plan: Ondansetron  Airway Management Planned:   Additional Equipment:   Intra-op Plan:   Post-operative Plan:   Informed Consent: I have reviewed the patients History and Physical, chart, labs and discussed the procedure including the risks, benefits and alternatives for the proposed anesthesia with the patient or authorized representative who has indicated his/her understanding and acceptance.     Dental Advisory Given  Plan Discussed with: CRNA  Anesthesia Plan Comments:         Anesthesia Quick Evaluation

## 2020-11-24 NOTE — Anesthesia Procedure Notes (Signed)
Procedure Name: Intubation Date/Time: 11/24/2020 9:17 AM Performed by: Ollen Bowl, CRNA Pre-anesthesia Checklist: Patient identified, Patient being monitored, Timeout performed, Emergency Drugs available and Suction available Patient Re-evaluated:Patient Re-evaluated prior to induction Oxygen Delivery Method: Circle System Utilized Preoxygenation: Pre-oxygenation with 100% oxygen Induction Type: IV induction Ventilation: Mask ventilation without difficulty Laryngoscope Size: Mac and 3 Grade View: Grade II Tube type: Oral Tube size: 7.0 mm Number of attempts: 1 Airway Equipment and Method: stylet Placement Confirmation: ETT inserted through vocal cords under direct vision, positive ETCO2 and breath sounds checked- equal and bilateral Secured at: 21 cm Tube secured with: Tape Dental Injury: Teeth and Oropharynx as per pre-operative assessment

## 2020-11-24 NOTE — Anesthesia Postprocedure Evaluation (Signed)
Anesthesia Post Note  Patient: Hannah Peters  Procedure(s) Performed: LAPAROSCOPIC BILATERAL SALPINGECTOMY (Bilateral: Abdomen)  Patient location during evaluation: Phase II Anesthesia Type: General Level of consciousness: awake Pain management: pain level controlled Vital Signs Assessment: post-procedure vital signs reviewed and stable Respiratory status: spontaneous breathing and respiratory function stable Cardiovascular status: blood pressure returned to baseline and stable Postop Assessment: no headache and no apparent nausea or vomiting Anesthetic complications: no Comments: Late entry   No notable events documented.   Last Vitals:  Vitals:   11/24/20 0815 11/24/20 0830  BP:    Pulse: 83 71  Resp: 19 15  Temp:    SpO2: 100% 97%    Last Pain:  Vitals:   11/24/20 0801  PainSc: 0-No pain                 Windell Norfolk

## 2020-11-25 ENCOUNTER — Encounter (HOSPITAL_COMMUNITY): Payer: Self-pay | Admitting: Obstetrics & Gynecology

## 2020-11-25 ENCOUNTER — Encounter: Payer: Self-pay | Admitting: Obstetrics & Gynecology

## 2020-11-26 LAB — SURGICAL PATHOLOGY

## 2020-12-02 ENCOUNTER — Encounter: Payer: BC Managed Care – PPO | Admitting: Obstetrics & Gynecology

## 2021-02-19 ENCOUNTER — Encounter: Payer: Self-pay | Admitting: Radiology

## 2022-02-02 ENCOUNTER — Other Ambulatory Visit: Payer: Self-pay

## 2022-10-16 ENCOUNTER — Other Ambulatory Visit: Payer: Self-pay
